# Patient Record
Sex: Male | Born: 1970 | Race: White | Hispanic: No | Smoking: Never smoker
Health system: Southern US, Community
[De-identification: ages and names within clinical notes are randomized; demographics above are authoritative.]

## PROBLEM LIST (undated history)

## (undated) DIAGNOSIS — Z8489 Family history of other specified conditions: Secondary | ICD-10-CM

## (undated) DIAGNOSIS — R112 Nausea with vomiting, unspecified: Secondary | ICD-10-CM

## (undated) DIAGNOSIS — Z1501 Genetic susceptibility to malignant neoplasm of breast: Secondary | ICD-10-CM

## (undated) DIAGNOSIS — Z1509 Genetic susceptibility to other malignant neoplasm: Secondary | ICD-10-CM

## (undated) DIAGNOSIS — I82409 Acute embolism and thrombosis of unspecified deep veins of unspecified lower extremity: Secondary | ICD-10-CM

## (undated) DIAGNOSIS — Z9889 Other specified postprocedural states: Secondary | ICD-10-CM

## (undated) DIAGNOSIS — Z87442 Personal history of urinary calculi: Secondary | ICD-10-CM

## (undated) HISTORY — DX: Other specified postprocedural states: R11.2

## (undated) HISTORY — PX: KNEE ARTHROSCOPY W/ ACL RECONSTRUCTION: SHX1858

## (undated) HISTORY — DX: Other specified postprocedural states: Z98.890

## (undated) HISTORY — DX: Genetic susceptibility to malignant neoplasm of breast: Z15.01

## (undated) HISTORY — DX: Genetic susceptibility to malignant neoplasm of breast: Z15.09

## (undated) HISTORY — DX: Acute embolism and thrombosis of unspecified deep veins of unspecified lower extremity: I82.409

---

## 2004-11-10 ENCOUNTER — Emergency Department: Payer: Self-pay | Admitting: Emergency Medicine

## 2010-01-13 ENCOUNTER — Ambulatory Visit: Payer: Self-pay | Admitting: Orthopedic Surgery

## 2010-01-19 ENCOUNTER — Ambulatory Visit: Payer: Self-pay | Admitting: Orthopedic Surgery

## 2010-01-23 ENCOUNTER — Ambulatory Visit: Payer: Self-pay

## 2010-02-02 ENCOUNTER — Ambulatory Visit: Payer: Self-pay | Admitting: Family Medicine

## 2010-10-29 DIAGNOSIS — I82409 Acute embolism and thrombosis of unspecified deep veins of unspecified lower extremity: Secondary | ICD-10-CM

## 2010-10-29 HISTORY — DX: Acute embolism and thrombosis of unspecified deep veins of unspecified lower extremity: I82.409

## 2011-02-22 ENCOUNTER — Emergency Department: Payer: Self-pay | Admitting: Emergency Medicine

## 2011-06-06 ENCOUNTER — Ambulatory Visit: Payer: Self-pay | Admitting: Urology

## 2013-07-07 ENCOUNTER — Ambulatory Visit: Payer: Self-pay | Admitting: Family Medicine

## 2013-10-29 DIAGNOSIS — Z87442 Personal history of urinary calculi: Secondary | ICD-10-CM

## 2013-10-29 HISTORY — DX: Personal history of urinary calculi: Z87.442

## 2015-02-15 ENCOUNTER — Ambulatory Visit (INDEPENDENT_AMBULATORY_CARE_PROVIDER_SITE_OTHER): Payer: BC Managed Care – PPO | Admitting: Family Medicine

## 2015-02-15 ENCOUNTER — Encounter: Payer: Self-pay | Admitting: Family Medicine

## 2015-02-15 VITALS — BP 134/78 | HR 79 | Temp 97.9°F | Ht 71.25 in | Wt 176.5 lb

## 2015-02-15 DIAGNOSIS — N200 Calculus of kidney: Secondary | ICD-10-CM | POA: Insufficient documentation

## 2015-02-15 DIAGNOSIS — Z8481 Family history of carrier of genetic disease: Secondary | ICD-10-CM | POA: Diagnosis not present

## 2015-02-15 DIAGNOSIS — Z0000 Encounter for general adult medical examination without abnormal findings: Secondary | ICD-10-CM | POA: Diagnosis not present

## 2015-02-15 LAB — COMPREHENSIVE METABOLIC PANEL
ALBUMIN: 4.4 g/dL (ref 3.5–5.2)
ALT: 21 U/L (ref 0–53)
AST: 19 U/L (ref 0–37)
Alkaline Phosphatase: 46 U/L (ref 39–117)
BILIRUBIN TOTAL: 0.7 mg/dL (ref 0.2–1.2)
BUN: 13 mg/dL (ref 6–23)
CALCIUM: 9.3 mg/dL (ref 8.4–10.5)
CO2: 31 meq/L (ref 19–32)
CREATININE: 0.79 mg/dL (ref 0.40–1.50)
Chloride: 103 mEq/L (ref 96–112)
GFR: 113.59 mL/min (ref >60.00)
GLUCOSE: 79 mg/dL (ref 70–99)
Potassium: 4.2 mEq/L (ref 3.5–5.1)
Sodium: 137 mEq/L (ref 135–145)
TOTAL PROTEIN: 6.5 g/dL (ref 6.0–8.3)

## 2015-02-15 LAB — CBC WITH DIFFERENTIAL/PLATELET
Basophils Absolute: 0 10*3/uL (ref 0.0–0.1)
Basophils Relative: 0.4 % (ref 0.0–3.0)
EOS ABS: 0 10*3/uL (ref 0.0–0.7)
Eosinophils Relative: 0.8 % (ref 0.0–5.0)
HEMATOCRIT: 41.8 % (ref 39.0–52.0)
Hemoglobin: 14.4 g/dL (ref 13.0–17.0)
Lymphocytes Relative: 24.5 % (ref 12.0–46.0)
Lymphs Abs: 1.6 10*3/uL (ref 0.7–4.0)
MCHC: 34.6 g/dL (ref 30.0–36.0)
MCV: 91.5 fl (ref 78.0–100.0)
MONO ABS: 0.5 10*3/uL (ref 0.1–1.0)
Monocytes Relative: 7.1 % (ref 3.0–12.0)
Neutro Abs: 4.3 10*3/uL (ref 1.4–7.7)
Neutrophils Relative %: 67.2 % (ref 43.0–77.0)
PLATELETS: 185 10*3/uL (ref 150.0–400.0)
RBC: 4.57 Mil/uL (ref 4.22–5.81)
RDW: 13 % (ref 11.5–15.5)
WBC: 6.4 10*3/uL (ref 4.0–10.5)

## 2015-02-15 LAB — LIPID PANEL
CHOL/HDL RATIO: 4
Cholesterol: 185 mg/dL (ref 0–200)
HDL: 49.7 mg/dL (ref >39.00)
LDL Cholesterol: 120 mg/dL — ABNORMAL HIGH (ref 0–99)
NonHDL: 135.3
Triglycerides: 76 mg/dL (ref 0.0–149.0)
VLDL: 15.2 mg/dL (ref 0.0–40.0)

## 2015-02-15 LAB — PSA: PSA: 1.19 ng/mL (ref 0.10–4.00)

## 2015-02-15 NOTE — Progress Notes (Signed)
Subjective:   Patient ID: Connor Stone, male    DOB: September 26, 1971, 44 y.o.   MRN: [id number]  Connor Stone is a pleasant 44 y.o. year old male who presents to clinic today with [place]  on 02/15/2015  HPI: Family h/o BRCA gene.  Mom died of breast CA when she was 34.  Sister who is 108 yo, just tested positive for BRCA.  He would like to be tested as well.  He has a 5 year old daughter.  H/o nephrolithiasis- thinks he passed one a few weeks ago. Diagnosed on CT in ER (first episode) 2 years ago.  He has not had routine lab work/CPE done in over a year. Dad has prostate CA.  No current outpatient prescriptions on file prior to visit.   No current facility-administered medications on file prior to visit.    No Known Allergies  History reviewed. No pertinent past medical history.  Past Surgical History  Procedure Laterality Date  . Arthroscopic repair acl      Family History  Problem Relation Age of Onset  . Cancer Mother   . Cancer Father   . Diabetes Father   . Cancer Sister     History   Social History  . Marital Status: Married    Spouse Name: N/A  . Number of Children: N/A  . Years of Education: N/A   Occupational History  . Not on file.   Social History Main Topics  . Smoking status: Former Research scientist (life sciences)  . Smokeless tobacco: Never Used  . Alcohol Use: Yes  . Drug Use: No  . Sexual Activity: Yes   Other Topics Concern  . Not on file   Social History Narrative   Married   PE teacher in middle school   73 year old daughter   The PMH, PSH, Social History, Family History, Medications, and allergies have been reviewed in [facility], and have been updated if relevant.  Review of Systems  Constitutional: Negative.   HENT: Negative.   Eyes: Negative.   Respiratory: Negative.   Cardiovascular: Negative.   Gastrointestinal: Negative.   Endocrine: Negative.   Musculoskeletal: Negative.   Skin: Negative.   Allergic/Immunologic: Negative.   Neurological:  Negative.   Hematological: Negative.   Psychiatric/Behavioral: Negative.   All other systems reviewed and are negative.      Objective:    BP 134/78 mmHg  Pulse 79  Temp(Src) 97.9 F (36.6 C) (Oral)  Ht 5' 11.25" (1.81 m)  Wt 176 lb 8 oz (80.06 kg)  BMI 24.44 kg/m2  SpO2 98%   Physical Exam  Constitutional: He is oriented to person, place, and time. He appears well-developed and well-nourished. No distress.  HENT:  Head: Normocephalic.  Eyes: Conjunctivae are normal.  Neck: Normal range of motion.  Cardiovascular: Normal rate and regular rhythm.   Pulmonary/Chest: Breath sounds normal. No respiratory distress. He has no wheezes. He has no rales. He exhibits no tenderness.  Abdominal: Soft.  Musculoskeletal: Normal range of motion. He exhibits no edema.  Neurological: He is alert and oriented to person, place, and time. No cranial nerve deficit.  Skin: Skin is warm and dry.  Psychiatric: He has a normal mood and affect. His behavior is normal. Judgment and thought content normal.  Nursing note and vitals reviewed.         Assessment & Plan:   Visit for well man health check - Plan: CBC with Differential/Platelet, Comprehensive metabolic panel, Lipid panel, PSA  Family history  of BRCA gene positive - Plan: Ambulatory referral to Genetics  Nephrolithiasis No Follow-up on file.

## 2015-02-15 NOTE — Assessment & Plan Note (Signed)
Reviewed preventive care protocols, scheduled due services, and updated immunizations Discussed nutrition, exercise, diet, and healthy lifestyle.  Orders Placed This Encounter  Procedures  . CBC with Differential/Platelet  . Comprehensive metabolic panel  . Lipid panel  . PSA  . Ambulatory referral to [facility]

## 2015-02-15 NOTE — Assessment & Plan Note (Addendum)
Reviewed his sister's chart/results that he brought with him today.  I agree he should be tested for himself and his daughter. Refer to geneticist.

## 2015-02-15 NOTE — Progress Notes (Signed)
Pre visit review using our clinic review tool, if applicable. No additional management support is needed unless otherwise documented below in the visit note. 

## 2015-02-15 NOTE — Assessment & Plan Note (Signed)
No symptoms currently.

## 2015-02-15 NOTE — Patient Instructions (Signed)
Nice to meet you. We will call you with your lab results and you can view them online. Please also stop by to see Rosaria Ferries on your way out to set up your genetics appointment.

## 2015-02-17 ENCOUNTER — Encounter: Payer: Self-pay | Admitting: *Deleted

## 2015-03-04 ENCOUNTER — Telehealth: Payer: Self-pay | Admitting: Genetic Counselor

## 2015-03-04 NOTE — Telephone Encounter (Signed)
Left second message for pt regarding gen counseling appt.

## 2015-03-09 ENCOUNTER — Telehealth: Payer: Self-pay | Admitting: Genetic Counselor

## 2015-03-09 NOTE — Telephone Encounter (Signed)
Attempted to reach patient for a third time.  Left additional message.  Will call referring office today regarding unsuccessful attempts to reach patient.

## 2015-03-15 ENCOUNTER — Telehealth: Payer: Self-pay | Admitting: Genetic Counselor

## 2015-03-15 NOTE — Telephone Encounter (Signed)
GENETIC REFERRAL-S/W PATIENT AND GVE APPT FOR 05/23 @ 11

## 2015-03-21 ENCOUNTER — Other Ambulatory Visit: Payer: BC Managed Care – PPO

## 2015-03-21 ENCOUNTER — Encounter: Payer: Self-pay | Admitting: Genetic Counselor

## 2015-03-21 ENCOUNTER — Ambulatory Visit (HOSPITAL_BASED_OUTPATIENT_CLINIC_OR_DEPARTMENT_OTHER): Payer: BC Managed Care – PPO | Admitting: Genetic Counselor

## 2015-03-21 DIAGNOSIS — Z315 Encounter for genetic counseling: Secondary | ICD-10-CM | POA: Diagnosis not present

## 2015-03-21 DIAGNOSIS — Z8481 Family history of carrier of genetic disease: Secondary | ICD-10-CM | POA: Diagnosis not present

## 2015-03-21 DIAGNOSIS — Z803 Family history of malignant neoplasm of breast: Secondary | ICD-10-CM

## 2015-03-21 DIAGNOSIS — Z8042 Family history of malignant neoplasm of prostate: Secondary | ICD-10-CM

## 2015-03-21 NOTE — Progress Notes (Signed)
[place] Patient Visit  REFERRING PROVIDER: Lucille Passy, MD [address] [place], [place] [address]  PRIMARY PROVIDER:  Arnette Norris, MD  PRIMARY REASON FOR VISIT:  1. Family history of BRCA1 gene positive   2. Family history of malignant neoplasm of breast   3. Family history of prostate cancer     HISTORY OF PRESENT ILLNESS:   Connor Stone, a 44 y.o. male, was seen for a [place] cancer genetics consultation at the request of Dr. Deborra Stone due to a family history of cancer and a recently identified BRCA1 pathogenic gene mutation found in Mr. Connor Stone sister called BRCA1, p.C61G.  Connor Stone presents to clinic today to discuss the possibility of a hereditary predisposition to cancer, genetic testing, and to further clarify his future cancer risks, as well as potential cancer risks for family members. He is accompanied to clinic today by his wife.   CANCER HISTORY:  Connor Stone has no personal history of cancer.   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 A copy of the pedigree with be scanned into Epic under the Media tab. Significant family history diagnoses include the following: Family History  Problem Relation Age of Onset   Cancer Mother 59    bilateral breast cancer; d. 15; no genetic testing   Cancer Father 34    prostate cancer, no genetic testing and is  an only child with a mother with cancer   Diabetes Father    Cancer Sister 8    breast cancer ; BRCA1 positive, sister is Connor Stone (DOB 06/27/70) tested at [facility] with a report date of 01/13/2015. Result is positive for a pathogenic mutation called BRCA1, p.C61G.   Cancer Paternal Grandmother 64    unknown type of rare cancer   Connor Stone ancestry is of [place] (paternal) and [place] (maternal[place] descent. There is no known Jewish ancestry or consanguinity.  GENETIC COUNSELING ASSESSMENT:  Connor Stone is a 44 y.o. male with a family history of cancer and a recently identified BRCA1 pathogenic gene mutation found in Connor Stone sister called BRCA1, p.C61G. Connor Stone is at 50% risk for this mutation. In addition, to date, the BRCA1 pathogenic mutation has not been confirmed to be on the maternal or paternal side of the family as Connor Stone father has not pursued genetic testing. His father was diagnosed with prostate cancer and is an only child, and his paternal grandmother had an unknown type of rare cancer per Connor Stone. We, therefore, discussed and recommended the following at today's visit.   DISCUSSION:  We reviewed the characteristics, features and inheritance patterns of hereditary cancer syndromes. We also discussed genetic testing, including the appropriate family members to test, the process of  testing, insurance coverage and turn-around-time for results. We discussed the implications of a negative, positive and/or variant of uncertain significant result. Because it is unclear which side of the family the mutation is on, we recommended Connor Stone pursue genetic testing for the CancerNext-Expanded gene panel. The CancerNext-Expanded gene panel offered by [facility] analyzes the following 49 genes: APC, ATM, BAP1, BARD1, BMPR1A, BRCA1, BRCA2, BRIP1, CDH1, CDK4, CDKN2A, CHEK2, EPCAM, FH, FLCN, GREM1, MAX, MEN1, MITF, MET, MLH1, MRE11A, MSH2, MSH6, MUTYH, NBN, NF1, PALB2, PMS2, POLD1, POLE,  PTEN, RAD50, RAD51C, RAD51D, RET, SDHA, SDHAF2, SDHB, SDHC, SDHD, SMAD4, SMARCA4, STK11, TMEM127, TP53, TSC1, TSC2, and VHL. We recommend this because although it is unlikely, it is possible this familial BRCA1 mutation found in Connor Stone sister was inherited from the paternal side of the family and his mother's early breast cancer diagnosis could have been due to a different hereditary cancer syndrome associated with a different gene mutation.    PLAN:  Based on our above recommendation, Connor Stone wished to pursue genetic testing and the blood sample was drawn and will be sent to [facility] for analysis. Results should be available within approximately 4 weeks time, at which point they will be disclosed by telephone to Connor Stone, as will any additional recommendations warranted by these results. We encouraged Connor Stone to remain in contact with cancer genetics annually so that we can continuously update the family history and inform him of any changes in cancer genetics and testing that may be of benefit for this family.   Mr.  Nidiffer questions were answered to his satisfaction today. Our contact information was provided should additional questions or concerns arise. Thank you for the referral and allowing [place] to share in the care of your patient.   Connor A. Fine, MS, CGC Certified Psychologist, [email or web address] phone: [phone]  The patient was seen for a total of 45 minutes in face-to-face genetic counseling.  This patient was discussed with Dr. Jana Stone who agrees with the above.    ______________________________________________________________________ For Office Staff:  Number of people involved in session including genetic counselor: 3 Was an intern or student involved with case: not applicable

## 2015-04-18 ENCOUNTER — Encounter: Payer: Self-pay | Admitting: Genetic Counselor

## 2015-04-18 DIAGNOSIS — Z8042 Family history of malignant neoplasm of prostate: Secondary | ICD-10-CM

## 2015-04-18 DIAGNOSIS — Z8481 Family history of carrier of genetic disease: Secondary | ICD-10-CM

## 2015-04-18 DIAGNOSIS — Z803 Family history of malignant neoplasm of breast: Secondary | ICD-10-CM

## 2015-04-18 NOTE — Progress Notes (Signed)
[facility] Genetic Test Results   REFERRING PROVIDER: Arnette Norris, MD  PRIMARY PROVIDER:  Arnette Norris, MD  PRIMARY REASON FOR VISIT:  Patient Active Problem List   Diagnosis Date Noted   Family history of malignant neoplasm of breast 03/21/2015   Family history of BRCA1 gene positive 03/21/2015   Family history of prostate cancer 03/21/2015   Family history of BRCA gene positive 02/15/2015   Nephrolithiasis 02/15/2015   Visit for well man health check 02/15/2015    GENETIC TEST RESULT:  Testing Laboratory: [facility]  Test Ordered: CancerNext-Expanded gene panel Date of Report: 04/12/2015 Result: Positive for a pathogenic gene mutation called BRCA1, p.C61G General Interpretation: Confirmed Hereditary Breast Ovarian Cancer syndrome, 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 Nancarrow was previously seen in the [facility] due to concerns regarding a hereditary predisposition to cancer. Please refer to our prior cancer genetics clinic note for more information regarding Mr. Shenk medical, social and family histories, and our assessment and recommendations, at the time. Mr. Portman genetic test results and recommendations warranted by these results were recently disclosed to him and are discussed in more detail below.  GENETIC TESTING: At the time of Mr. Stief visit, we recommended he pursue genetic testing of the CancerNext-Expanded gene panel. This test, which included sequencing and deletion/duplication analysis of several genes associated with increased risk for cancer, was performed at [facility]. The CancerNext-Expanded gene panel offered by [facility] analyzes the following 49 genes: APC, ATM, BAP1, BARD1, BMPR1A, BRCA1, BRCA2, BRIP1, CDH1, CDK4, CDKN2A, CHEK2, EPCAM, FH, FLCN, GREM1, MAX, MEN1, MITF, MET, MLH1, MRE11A, MSH2, MSH6, MUTYH, NBN, NF1, PALB2, PMS2, POLD1, POLE, PTEN, RAD50, RAD51C,  RAD51D, RET, SDHA, SDHAF2, SDHB, SDHC, SDHD, SMAD4, SMARCA4, STK11, TMEM127, TP53, TSC1, TSC2, and VHL.  Testing revealed a mutation in the BRCA1 gene called BRCA1, p.C61G.  MEDICAL MANAGEMENT: Individuals who have a BRCA1 pathogenic gene mutation have an increased risk for cancer. Thus, the following management guidelines and screening recommendations are recommended for Mr. Casteneda, which can coordinated by Mr. Capri primary physican or overseeing healthcare provider(s).  For men who harbor mutations, the general risk for cancer seems to be only slightly increased. As we discussed, BRCA1 mutations confer a slightly increased risk for breast cancer in men. We recommend that Mr. Zenon primary physician perform a clinical chest exam yearly. Alternatively, Mr. Yeargan can be seen by a high-risk breast clinic once a year for a clinical chest exam. We also recommend he perform chest self-exams routinely and if a mass is noted, this should be brought to his physican's attention immediately.   With regard to screening for other types of cancer, we estimate Mr. Sedano risk for prostate cancer to be only slightly increased over that of the general population risk. We simply suggest adhering faithfully to the general population screening recommendations including annual colon cancer screening starting at the age of 34 and prostate cancer screening if you and your physician decide to pursue that.  FAMILY MEMBERS: It is important that all of Mr. Veldhuizen relatives (both men and women) know of the presence of this gene mutation. Site-specific genetic testing can sort out who in the family is at risk and who is not.   Mr. Cosgriff daughter is at a 50% chance to have inherited this mutation. However, she is young and this will not be of any consequence to her for several years. We do not test children  because there is no risk to them until they are adults. We recommend they have genetic counseling and testing by the  time they are in their early 20s.    Mr. Buenger siblings each have a 50% chance to have inherited this mutation. We recommend they have genetic testing for this same mutation, as identifying the presence of this mutation would allow them to also take advantage of risk-reducing measures.   SUPPORT AND RESOURCES: If Mr. Taras is interested in BRCA-specific information and support, there are two groups, Facing Our Risk (www.facingourrisk.com) and Bright Pink (www.brightpink.org) which some people have found useful. They provide opportunities to speak with other individuals from high-risk families. To locate genetic counselors in other cities, visit the website of the [employer] (ArtistMovie.se) and Secretary/administrator for a Social worker by zip code.  We encouraged Mr. Swicegood to remain in contact with [place] on an annual basis so we can update his personal and family histories, and let him know of advances in cancer genetics that may benefit the family. Our contact number was provided. Mr. Kliebert questions were answered to his satisfaction today, and he knows he is welcome to call anytime with additional questions.    Catherine A. Fine, MS, CGC Certified Genetic Counselor phone: [phone] [email or web address]

## 2015-05-10 ENCOUNTER — Encounter: Payer: Self-pay | Admitting: Genetic Counselor

## 2015-05-10 ENCOUNTER — Telehealth: Payer: Self-pay | Admitting: Genetic Counselor

## 2015-05-10 DIAGNOSIS — Z0000 Encounter for general adult medical examination without abnormal findings: Secondary | ICD-10-CM | POA: Insufficient documentation

## 2015-05-10 DIAGNOSIS — Z1379 Encounter for other screening for genetic and chromosomal anomalies: Secondary | ICD-10-CM | POA: Insufficient documentation

## 2015-05-10 NOTE — Telephone Encounter (Signed)
Patient called for reassurance about his bill.  He received an EOB from [place] that states he will owe more than $5000.  We discussed that Cephus Shelling sees all the correspondence from [facility] that he sees, and that they can appeal up to about 3 times, so he may receive this again.  Cephus Shelling will not start a test if they determine a cost to be more than $100 OOP cost, so he should not be billed more than $100, regardless of how much [employer] pays or does not pay.  OFfered to give [facility]'s phone number.  He stated that this is similar to what Cat had told him, and therefore will wait until he receives a bill from [place].

## 2015-05-24 ENCOUNTER — Encounter: Payer: Self-pay | Admitting: Family Medicine

## 2015-05-24 ENCOUNTER — Ambulatory Visit (INDEPENDENT_AMBULATORY_CARE_PROVIDER_SITE_OTHER): Payer: BC Managed Care – PPO | Admitting: Family Medicine

## 2015-05-24 VITALS — BP 134/66 | HR 78 | Temp 98.1°F | Wt 175.5 lb

## 2015-05-24 DIAGNOSIS — K921 Melena: Secondary | ICD-10-CM | POA: Diagnosis not present

## 2015-05-24 DIAGNOSIS — Z1509 Genetic susceptibility to other malignant neoplasm: Secondary | ICD-10-CM

## 2015-05-24 DIAGNOSIS — R194 Change in bowel habit: Secondary | ICD-10-CM | POA: Diagnosis not present

## 2015-05-24 DIAGNOSIS — Z1501 Genetic susceptibility to malignant neoplasm of breast: Secondary | ICD-10-CM | POA: Diagnosis not present

## 2015-05-24 NOTE — Progress Notes (Signed)
Pre visit review using our clinic review tool, if applicable. No additional management support is needed unless otherwise documented below in the visit note. 

## 2015-05-24 NOTE — Patient Instructions (Signed)
Great to see you. Please stop by to see Connor Stone on [place] [address] on your way out.

## 2015-05-24 NOTE — Progress Notes (Signed)
Subjective:   Patient ID: Connor Stone, male    DOB: September 10, 1971, 44 y.o.   MRN: [id number]  Connor Stone is a pleasant 44 y.o. year old male who presents to clinic today with Abdominal Pain and Blood In Stools  on 05/24/2015  HPI:  3-6 months of intermittent episodes of abdominal cramping, loose stools with blood and mucous in his stools.  Pain not necessarily relieved by defecation.  No nausea or vomiting.  No sores in his mouth. No known family h/o IBD.  No black stools.  He did just test positive for BRCA gene.  Strong family h/o BRCA gene.  Mom died of breast CA when she was 14.  Sister who is 34 yo is also BRCA positive.  No current outpatient prescriptions on file prior to visit.   No current facility-administered medications on file prior to visit.    No Known Allergies  No past medical history on file.  Past Surgical History  Procedure Laterality Date  . Arthroscopic repair acl      Family History  Problem Relation Age of Onset  . Cancer Mother 44    bilateral breast cancer; d. 44; no genetic testing  . Cancer Father 44    prostate cancer, no genetic testing and is an only child with a mother with cancer  . Diabetes Father   . Cancer Sister 44    44 breast cancer ; BRCA1 positive, sister is Jermond Burkemper (DOB 06/27/70) tested at [facility] with a report date of 01/13/2015. Result is positive for a pathogenic mutation called BRCA1, p.C61G.  . Cancer Paternal Grandmother 44    unknown type of rare cancer    History   Social History  . Marital Status: Married    Spouse Name: N/A  . Number of Children: N/A  . Years of Education: N/A   Occupational History  . Not on file.   Social History Main Topics  . Smoking status: Former Research scientist (life sciences)  . Smokeless tobacco: Never Used  . Alcohol Use: Yes  . Drug Use: No  . Sexual Activity: Yes   Other Topics Concern  . 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   Review of Systems     Objective:    BP 134/66 mmHg  Pulse 78  Temp(Src) 98.1 F (36.7 C) (Oral)  Wt 175 lb 8 oz (79.606 kg)  SpO2 99%  Wt Readings from Last 3 Encounters:  05/24/15 175 lb 8 oz (79.606 kg)  02/15/15 176 lb 8 oz (80.06 kg)    Physical Exam  Constitutional: He is oriented to person, place, and time. He appears well-developed and well-nourished. No distress.  HENT:  Head: Normocephalic.  Eyes: Conjunctivae are normal.  Cardiovascular: Normal rate.   Pulmonary/Chest: Effort normal.  Musculoskeletal: Normal range of motion.  Neurological: He is alert and oriented to person, place, and time.  Skin: Skin is warm and dry.  Psychiatric: He has a normal mood and affect. His behavior is normal. Thought content normal.  Nursing note and vitals reviewed.         Assessment & Plan:   Change in bowel habits - Plan: Ambulatory referral to Gastroenterology  BRCA positive - Plan: Ambulatory referral to Gastroenterology  Blood in stool - Plan: Ambulatory referral to Gastroenterology No Follow-up on file.

## 2015-05-24 NOTE — Assessment & Plan Note (Signed)
New- intermittent with mucous and abdominal pain. Also strong FH of CA with BRCA gene confirmed. Refer for colonoscopy- rule out IBD/malignancy. The patient indicates understanding of these issues and agrees with the plan.

## 2015-05-26 ENCOUNTER — Ambulatory Visit (INDEPENDENT_AMBULATORY_CARE_PROVIDER_SITE_OTHER): Payer: BC Managed Care – PPO | Admitting: Nurse Practitioner

## 2015-05-26 ENCOUNTER — Encounter: Payer: Self-pay | Admitting: Nurse Practitioner

## 2015-05-26 VITALS — BP 120/74 | HR 68 | Ht 71.5 in | Wt 175.0 lb

## 2015-05-26 DIAGNOSIS — K625 Hemorrhage of anus and rectum: Secondary | ICD-10-CM | POA: Diagnosis not present

## 2015-05-26 MED ORDER — NA SULFATE-K SULFATE-MG SULF 17.5-3.13-1.6 GM/177ML PO SOLN: 1.0000 | Freq: Once | ORAL | Status: DC

## 2015-05-26 MED ORDER — DICYCLOMINE HCL 20 MG PO TABS: 20.0000 mg | ORAL_TABLET | Freq: Two times a day (BID) | ORAL | Status: DC

## 2015-05-26 NOTE — Progress Notes (Addendum)
    HPI :  Patient is a 44 year old male referred by PCP for evaluation of rectal bleeding. Over the last couple of years patient has had periods of lower abdominal cramping, occasional loose stool but no significant diarrhea. The rectal bleeding is episodic. In between episodes of bowel changes and rectal bleeding patient feels completely fine.  Patient told by health care provider less than a year ago that he has hemorrhoids. His weight is stable. Normal CBC mid June.   Past Medical History  Diagnosis Date  . Kidney stones   . DVT (deep venous thrombosis)     right leg  . Post-operative nausea and vomiting   . BRCA1 positive     Family History  Problem Relation Age of Onset  . Breast cancer Mother 18    bilateral breast cancer; d. 15; no genetic testing  . Prostate cancer Father 54    no genetic testing and is an only child with a mother with cancer  . Diabetes Father   . Breast cancer Sister 3     BRCA1 positive, sister is Miner Koral (DOB 06/27/70) tested at [facility] with a report date of 01/13/2015. Result is positive for a pathogenic mutation called BRCA1, p.C61G.  . Cancer Paternal Grandmother 33    unknown type of rare cancer   History  Substance Use Topics  . Smoking status: Never Smoker   . Smokeless tobacco: Never Used  . Alcohol Use: 0.0 oz/week    0 Standard drinks or equivalent per week     Comment: 2-4 per day   No current outpatient prescriptions on file.   No current facility-administered medications for this visit.   No Known Allergies   Review of Systems: All systems reviewed and negative except where noted in HPI.   Physical Exam: BP 120/74 mmHg  Pulse 68  Ht 5' 11.5" (1.816 m)  Wt 175 lb (79.379 kg)  BMI 24.07 kg/m2 Constitutional: Pleasant,well-developed, white male in no acute distress. HEENT: Normocephalic and atraumatic. Conjunctivae are normal. No scleral icterus. Neck supple.  Cardiovascular: Normal rate, regular rhythm.  Pulmonary/chest:  Effort normal and breath sounds normal. No wheezing, rales or rhonchi. Abdominal: Soft, nondistended, nontender. Bowel sounds active throughout. There are no masses palpable. No hepatomegaly. Extremities: no edema Lymphadenopathy: No cervical adenopathy noted. Neurological: Alert and oriented to person place and time. Skin: Skin is warm and dry. No rashes noted. Psychiatric: Normal mood and affect. Behavior is normal.   ASSESSMENT AND PLAN:  44 year old male two year history of episodic lower abdominal cramps, occasional loose stool and intermittent rectal bleeding. Patient gives history of hemorrhoids. He could have IBS with hemorrhoidal bleeding. Colitis, colon neoplasm and other etiologies need to be excluded. For further evaluation patient will be scheduled for colonoscopy with Dr. Hilarie Fredrickson. The risks, benefits, and alternatives to colonoscopy with possible biopsy and possible polypectomy were discussed with the patient and he consents to proceed.  Trial of Bentyl twice daily as needed for cramps  Cc:  Arnette Norris, MD  Addendum: Reviewed and agree with initial management. Jerene Bears, MD

## 2015-05-26 NOTE — Addendum Note (Signed)
Addended by: Willia Craze on: 05/26/2015 04:17 PM   Modules accepted: Level of Service

## 2015-05-26 NOTE — Patient Instructions (Signed)
We have sent the following medications to your pharmacy for you to pick up at your convenience:  Bentyl  You have been scheduled for a colonoscopy. Please follow written instructions given to you at your visit today.  Please pick up your prep supplies at the pharmacy within the next 1-3 days. If you use inhalers (even only as needed), please bring them with you on the day of your procedure.  

## 2015-08-04 ENCOUNTER — Encounter: Payer: Self-pay | Admitting: Internal Medicine

## 2015-08-04 ENCOUNTER — Ambulatory Visit (AMBULATORY_SURGERY_CENTER): Payer: BC Managed Care – PPO | Admitting: Internal Medicine

## 2015-08-04 VITALS — BP 148/98 | HR 70 | Temp 96.4°F | Resp 16 | Ht 71.0 in | Wt 175.0 lb

## 2015-08-04 DIAGNOSIS — D120 Benign neoplasm of cecum: Secondary | ICD-10-CM

## 2015-08-04 DIAGNOSIS — R194 Change in bowel habit: Secondary | ICD-10-CM

## 2015-08-04 DIAGNOSIS — K625 Hemorrhage of anus and rectum: Secondary | ICD-10-CM | POA: Diagnosis not present

## 2015-08-04 DIAGNOSIS — K635 Polyp of colon: Secondary | ICD-10-CM | POA: Diagnosis not present

## 2015-08-04 DIAGNOSIS — D125 Benign neoplasm of sigmoid colon: Secondary | ICD-10-CM

## 2015-08-04 DIAGNOSIS — D127 Benign neoplasm of rectosigmoid junction: Secondary | ICD-10-CM | POA: Diagnosis not present

## 2015-08-04 DIAGNOSIS — K921 Melena: Secondary | ICD-10-CM | POA: Diagnosis not present

## 2015-08-04 MED ORDER — SODIUM CHLORIDE 0.9 % IV SOLN: 500.0000 mL | INTRAVENOUS | Status: DC

## 2015-08-04 NOTE — Progress Notes (Signed)
Transferred to recovery room. A/O x3, pleased with MAC.  VSS.  Report to Karen, RN. 

## 2015-08-04 NOTE — Progress Notes (Signed)
Called to room to assist during endoscopic procedure.  Patient ID and intended procedure confirmed with present staff. Received instructions for my participation in the procedure from the performing physician.  

## 2015-08-04 NOTE — Patient Instructions (Addendum)
  AVOID ASPIRIN, ASPIRIN PRODUCTS AND NSAIDS (MOTRIN, ADVIL, ALEVE, IBUPROFEN ETC) FOR TWO WEEKS, October 20,2016.   YOU HAD AN ENDOSCOPIC PROCEDURE TODAY AT [facility]:   Refer to the procedure report that was given to you for any specific questions about what was found during the examination.  If the procedure report does not answer your questions, please call your gastroenterologist to clarify.  If you requested that your care partner not be given the details of your procedure findings, then the procedure report has been included in a sealed envelope for you to review at your convenience later.  YOU SHOULD EXPECT: Some feelings of bloating in the abdomen. Passage of more gas than usual.  Walking can help get rid of the air that was put into your GI tract during the procedure and reduce the bloating. If you had a lower endoscopy (such as a colonoscopy or flexible sigmoidoscopy) you may notice spotting of blood in your stool or on the toilet paper. If you underwent a bowel prep for your procedure, you may not have a normal bowel movement for a few days.  Please Note:  You might notice some irritation and congestion in your nose or some drainage.  This is from the oxygen used during your procedure.  There is no need for concern and it should clear up in a day or so.  SYMPTOMS TO REPORT IMMEDIATELY:   Following lower endoscopy (colonoscopy or flexible sigmoidoscopy):  Excessive amounts of blood in the stool  Significant tenderness or worsening of abdominal pains  Swelling of the abdomen that is new, acute  Fever of 100F or higher   For urgent or emergent issues, a gastroenterologist can be reached at any hour by calling [phone].   DIET: Your first meal following the procedure should be a small meal and then it is ok to progress to your normal diet. Heavy or fried foods are harder to digest and may make you feel nauseous or bloated.  Likewise, meals heavy in dairy and  vegetables can increase bloating.  Drink plenty of fluids but you should avoid alcoholic beverages for 24 hours.  ACTIVITY:  You should plan to take it easy for the rest of today and you should NOT DRIVE or use heavy machinery until tomorrow (because of the sedation medicines used during the test).    FOLLOW UP: Our staff will call the number listed on your records the next business day following your procedure to check on you and address any questions or concerns that you may have regarding the information given to you following your procedure. If we do not reach you, we will leave a message.  However, if you are feeling well and you are not experiencing any problems, there is no need to return our call.  We will assume that you have returned to your regular daily activities without incident.  If any biopsies were taken you will be contacted by phone or by letter within the next 1-3 weeks.  Please call us at [phone] if you have not heard about the biopsies in 3 weeks.    SIGNATURES/CONFIDENTIALITY: You and/or your care partner have signed paperwork which will be entered into your electronic medical record.  These signatures attest to the fact that that the information above on your After Visit Summary has been reviewed and is understood.  Full responsibility of the confidentiality of this discharge information lies with you and/or your care-partner.

## 2015-08-04 NOTE — Op Note (Signed)
[place]  [address] [place], [id number]   COLONOSCOPY PROCEDURE REPORT  PATIENT: Connor Stone, Connor Stone  MR#: [id number] BIRTHDATE: 20-Aug-1971 , 43  yrs. old GENDER: male ENDOSCOPIST: Jerene Bears, MD PROCEDURE DATE:  08/04/2015 PROCEDURE:   Colonoscopy, diagnostic, Colonoscopy with biopsy, Colonoscopy with cold biopsy polypectomy, Colonoscopy with snare polypectomy, and Submucosal injection, any substance First Screening Colonoscopy - Avg.  risk and is 50 yrs.  old or older - No.  Prior Negative Screening - Now for repeat screening. N/A  History of Adenoma - Now for follow-up colonoscopy & has been > or = to 3 yrs.  N/A  Polyps removed today? Yes ASA CLASS:   Class II INDICATIONS:rectal bleeding, intermittent lower abd cramping pain with loose stools and tenesmus. MEDICATIONS: Monitored anesthesia care and Propofol 550 mg IV  DESCRIPTION OF PROCEDURE:   After the risks benefits and alternatives of the procedure were thoroughly explained, informed consent was obtained.  The digital rectal exam revealed no rectal mass.   The LB [id number]  endoscope was introduced through the anus and advanced to the terminal ileum which was intubated for a short distance. No adverse events experienced.   The quality of the prep was good.  (Suprep was used)  The instrument was then slowly withdrawn as the colon was fully examined. Estimated blood loss is zero unless otherwise noted in this procedure report.    COLON FINDINGS: The examined terminal ileum appeared to be normal. A sessile polyp measuring 2 mm in size was found at the cecum.  A polypectomy was performed with cold forceps.  The resection was complete, the polyp tissue was completely retrieved and sent to histology.   A pedunculated polyp measuring 25 mm in size was found in the sigmoid colon.  A 1:10,000 Epinephrine solution injection was given to deliver sclerosing therapy during scope insertion. During  withdrawal a polypectomy was performed using snare cautery. The resection was complete, the polyp tissue was completely retrieved and sent to histology.  The polypectomy site was closed by placing a single hemoclip.   There was moderate diverticulosis noted throughout the entire examined colon with associated mild inflammatory changes in the left colon.  Biopsies were obtained from the left colon.  Retroflexed views revealed internal hemorrhoids. The time to cecum = 10.7 Withdrawal time = 18.4   The scope was withdrawn and the procedure completed.  COMPLICATIONS: There were no immediate complications.  ENDOSCOPIC IMPRESSION: 1.   The examined terminal ileum appeared to be normal 2.   Sessile polyp was found at the cecum; polypectomy was performed with cold forceps 3.   Pedunculated polyp was found in the sigmoid colon; injection was given to deliver sclerosing therapy; polypectomy was performed using snare cautery; 1 hemoclip placed for prophylaxis of bleeding 4.   There was moderate diverticulosis noted throughout the entire examined colon  RECOMMENDATIONS: 1.  Hold Aspirin and all other NSAIDs for 2 weeks. 2.  Await pathology results 3.  Timing of repeat colonoscopy will be determined by pathology findings. 4.  You will receive a letter within 1-2 weeks with the results of your biopsy as well as final recommendations.  Please call my office if you have not received a letter after 3 weeks.  eSigned:  Jerene Bears, MD 08/04/2015 8:48 AM   cc: Arnette Norris MD and The Patient   PATIENT NAME:  Connor Stone, Connor Stone

## 2015-08-05 ENCOUNTER — Telehealth: Payer: Self-pay | Admitting: *Deleted

## 2015-08-05 NOTE — Telephone Encounter (Signed)
  Follow up Call-  Call back number 08/04/2015  Post procedure Call Back phone  # [phone]  Permission to leave phone message Yes     Patient questions:  Do you have a fever, pain , or abdominal swelling? No. Pain Score  0 *  Have you tolerated food without any problems? Yes.    Have you been able to return to your normal activities? Yes.    Do you have any questions about your discharge instructions: Diet   No. Medications  No. Follow up visit  No.  Do you have questions or concerns about your Care? No.  Actions: * If pain score is 4 or above: No action needed, pain <4.

## 2015-08-09 ENCOUNTER — Encounter: Payer: Self-pay | Admitting: Internal Medicine

## 2016-02-17 ENCOUNTER — Emergency Department: Payer: BC Managed Care – PPO

## 2016-02-17 ENCOUNTER — Emergency Department
Admission: EM | Admit: 2016-02-17 | Discharge: 2016-02-17 | Disposition: A | Discharge status: Home / Self Care | Payer: BC Managed Care – PPO | Attending: Emergency Medicine | Admitting: Emergency Medicine

## 2016-02-17 ENCOUNTER — Encounter: Payer: Self-pay | Admitting: Emergency Medicine

## 2016-02-17 DIAGNOSIS — S0990XA Unspecified injury of head, initial encounter: Secondary | ICD-10-CM | POA: Diagnosis not present

## 2016-02-17 DIAGNOSIS — I82401 Acute embolism and thrombosis of unspecified deep veins of right lower extremity: Secondary | ICD-10-CM | POA: Insufficient documentation

## 2016-02-17 DIAGNOSIS — S20212A Contusion of left front wall of thorax, initial encounter: Secondary | ICD-10-CM | POA: Diagnosis not present

## 2016-02-17 DIAGNOSIS — Y9383 Activity, rough housing and horseplay: Secondary | ICD-10-CM | POA: Insufficient documentation

## 2016-02-17 DIAGNOSIS — R55 Syncope and collapse: Secondary | ICD-10-CM | POA: Insufficient documentation

## 2016-02-17 DIAGNOSIS — Y999 Unspecified external cause status: Secondary | ICD-10-CM | POA: Diagnosis not present

## 2016-02-17 DIAGNOSIS — W01198A Fall on same level from slipping, tripping and stumbling with subsequent striking against other object, initial encounter: Secondary | ICD-10-CM | POA: Diagnosis not present

## 2016-02-17 DIAGNOSIS — Y92000 Kitchen of unspecified non-institutional (private) residence as  the place of occurrence of the external cause: Secondary | ICD-10-CM | POA: Diagnosis not present

## 2016-02-17 DIAGNOSIS — R0789 Other chest pain: Secondary | ICD-10-CM | POA: Diagnosis present

## 2016-02-17 DIAGNOSIS — S20219A Contusion of unspecified front wall of thorax, initial encounter: Secondary | ICD-10-CM

## 2016-02-17 LAB — COMPREHENSIVE METABOLIC PANEL
ALT: 20 U/L (ref 17–63)
ANION GAP: 6 (ref 5–15)
AST: 17 U/L (ref 15–41)
Albumin: 4 g/dL (ref 3.5–5.0)
Alkaline Phosphatase: 46 U/L (ref 38–126)
BILIRUBIN TOTAL: 0.8 mg/dL (ref 0.3–1.2)
BUN: 17 mg/dL (ref 6–20)
CALCIUM: 8.3 mg/dL — AB (ref 8.9–10.3)
CO2: 25 mmol/L (ref 22–32)
Chloride: 104 mmol/L (ref 101–111)
Creatinine, Ser: 0.86 mg/dL (ref 0.61–1.24)
GFR calc Af Amer: >60 mL/min (ref >60)
Glucose, Bld: 112 mg/dL — ABNORMAL HIGH (ref 65–99)
POTASSIUM: 3.6 mmol/L (ref 3.5–5.1)
Sodium: 135 mmol/L (ref 135–145)
TOTAL PROTEIN: 6.5 g/dL (ref 6.5–8.1)

## 2016-02-17 LAB — CBC
HEMATOCRIT: 38.1 % — AB (ref 40.0–52.0)
Hemoglobin: 13.6 g/dL (ref 13.0–18.0)
MCH: 31.7 pg (ref 26.0–34.0)
MCHC: 35.6 g/dL (ref 32.0–36.0)
MCV: 88.9 fL (ref 80.0–100.0)
Platelets: 179 10*3/uL (ref 150–440)
RBC: 4.28 MIL/uL — ABNORMAL LOW (ref 4.40–5.90)
RDW: 13.2 % (ref 11.5–14.5)
WBC: 7.9 10*3/uL (ref 3.8–10.6)

## 2016-02-17 LAB — TROPONIN I: Troponin I: <0.03 ng/mL (ref <0.031)

## 2016-02-17 MED ORDER — SODIUM CHLORIDE 0.9 % IV SOLN
1000.0000 mL | Freq: Once | INTRAVENOUS | Status: AC
  Administered 2016-02-17: 1000 mL via INTRAVENOUS

## 2016-02-17 MED ORDER — KETOROLAC TROMETHAMINE 30 MG/ML IJ SOLN
30.0000 mg | Freq: Once | INTRAMUSCULAR | Status: AC
  Administered 2016-02-17: 30 mg via INTRAVENOUS
  Filled 2016-02-17: qty 1

## 2016-02-17 MED ORDER — NAPROXEN 500 MG PO TABS: 500.0000 mg | ORAL_TABLET | Freq: Two times a day (BID) | ORAL | Status: DC

## 2016-02-17 MED ORDER — HYDROCODONE-ACETAMINOPHEN 5-325 MG PO TABS: 1.0000 | ORAL_TABLET | ORAL | Status: DC | PRN

## 2016-02-17 MED ORDER — HYDROCODONE-ACETAMINOPHEN 5-325 MG PO TABS
1.0000 | ORAL_TABLET | Freq: Once | ORAL | Status: AC
  Administered 2016-02-17: 1 via ORAL
  Filled 2016-02-17: qty 1

## 2016-02-17 NOTE — ED Notes (Signed)
Pt presents to via EMS with c/o mid to left chest pain pain and headache. Pt reports that he started having chest after falling backward while horse playing with his daughter. Pt reports syncope x2 afterward. Pt alerts and oriented x4 at this time, airway intact.

## 2016-02-17 NOTE — ED Notes (Signed)
Patient transported to CT 

## 2016-02-17 NOTE — Discharge Instructions (Signed)
Chest Contusion A chest contusion is a deep bruise on your chest area. Contusions are the result of an injury that caused bleeding under the skin. A chest contusion may involve bruising of the skin, muscles, or ribs. The contusion may turn blue, purple, or yellow. Minor injuries will give you a painless contusion, but more severe contusions may stay painful and swollen for a few weeks. CAUSES  A contusion is usually caused by a blow, trauma, or direct force to an area of the body. SYMPTOMS   Swelling and redness of the injured area.  Discoloration of the injured area.  Tenderness and soreness of the injured area.  Pain. DIAGNOSIS  The diagnosis can be made by taking a history and performing a physical exam. An X-ray, CT scan, or MRI may be needed to determine if there were any associated injuries, such as broken bones (fractures) or internal injuries. TREATMENT  Often, the best treatment for a chest contusion is resting, icing, and applying cold compresses to the injured area. Deep breathing exercises may be recommended to reduce the risk of pneumonia. Over-the-counter medicines may also be recommended for pain control. HOME CARE INSTRUCTIONS   Put ice on the injured area.  Put ice in a plastic bag.  Place a towel between your skin and the bag.  Leave the ice on for 15-20 minutes, 03-04 times a day.  Only take over-the-counter or prescription medicines as directed by your caregiver. Your caregiver may recommend avoiding anti-inflammatory medicines (aspirin, ibuprofen, and naproxen) for 48 hours because these medicines may increase bruising.  Rest the injured area.  Perform deep-breathing exercises as directed by your caregiver.  Stop smoking if you smoke.  Do not lift objects over 5 pounds (2.3 kg) for 3 days or longer if recommended by your caregiver. SEEK IMMEDIATE MEDICAL CARE IF:   You have increased bruising or swelling.  You have pain that is getting worse.  You have  difficulty breathing.  You have dizziness, weakness, or fainting.  You have blood in your urine or stool.  You cough up or vomit blood.  Your swelling or pain is not relieved with medicines. MAKE SURE YOU:   Understand these instructions.  Will watch your condition.  Will get help right away if you are not doing well or get worse.   This information is not intended to replace advice given to you by your health care provider. Make sure you discuss any questions you have with your health care provider.   Document Released: 07/10/2001 Document Revised: 07/09/2012 Document Reviewed: 04/07/2012 Elsevier Interactive Patient Education 2016 [place] Injury, Adult You have a head injury. Headaches and throwing up (vomiting) are common after a head injury. It should be easy to wake up from sleeping. Sometimes you must stay in the hospital. Most problems happen within the first 24 hours. Side effects may occur up to 7-10 days after the injury.  WHAT ARE THE TYPES OF HEAD INJURIES? Head injuries can be as minor as a bump. Some head injuries can be more severe. More severe head injuries include:  A jarring injury to the brain (concussion).  A bruise of the brain (contusion). This mean there is bleeding in the brain that can cause swelling.  A cracked skull (skull fracture).  Bleeding in the brain that collects, clots, and forms a bump (hematoma). WHEN SHOULD I GET HELP RIGHT AWAY?   You are confused or sleepy.  You cannot be woken up.  You feel sick to  your stomach (nauseous) or keep throwing up (vomiting).  Your dizziness or unsteadiness is getting worse.  You have very bad, lasting headaches that are not helped by medicine. Take medicines only as told by your doctor.  You cannot use your arms or legs like normal.  You cannot walk.  You notice changes in the black spots in the center of the colored part of your eye (pupil).  You have clear or bloody fluid coming  from your nose or ears.  You have trouble seeing. During the next 24 hours after the injury, you must stay with someone who can watch you. This person should get help right away (call 911 in the [place] if you start to shake and are not able to control it (have seizures), you pass out, or you are unable to wake up. HOW CAN I PREVENT A HEAD INJURY IN THE FUTURE?  Wear seat belts.  Wear a helmet while bike riding and playing sports like football.  Stay away from dangerous activities around the house. WHEN CAN I RETURN TO NORMAL ACTIVITIES AND ATHLETICS? See your doctor before doing these activities. You should not do normal activities or play contact sports until 1 week after the following symptoms have stopped:  Headache that does not go away.  Dizziness.  Poor attention.  Confusion.  Memory problems.  Sickness to your stomach or throwing up.  Tiredness.  Fussiness.  Bothered by bright lights or loud noises.  Anxiousness or depression.  Restless sleep. MAKE SURE YOU:   Understand these instructions.  Will watch your condition.  Will get help right away if you are not doing well or get worse.   This information is not intended to replace advice given to you by your health care provider. Make sure you discuss any questions you have with your health care provider.   Document Released: 09/27/2008 Document Revised: 11/05/2014 Document Reviewed: 06/22/2013 Elsevier Interactive Patient Education [employer].

## 2016-02-17 NOTE — ED Provider Notes (Signed)
[facility] Emergency Department Provider Note  ____________________________________________    I have reviewed the triage vital signs and the nursing notes.   HISTORY  Chief Complaint Chest Pain    HPI Connor Stone is a 45 y.o. male who presents with complaints of dizziness and syncope. Patient reports he was "horsing around" with his daughter and chasing her and she slipped in the kitchen and he moved quickly to avoid stepping on her and fell and struck his chest against the [employer]. He was very worried about his daughter because she had fallen hard and while he was tending to her he became lightheaded and apparently syncopized. He reports chest discomfort where his chest struck the [place] and mild posterior head pain where he hit his head when he syncopized. No History of heart disease.     Past Medical History  Diagnosis Date  . Kidney stones   . DVT (deep venous thrombosis) (HCC)     right leg  . Post-operative nausea and vomiting   . BRCA1 positive     Patient Active Problem List   Diagnosis Date Noted  . Rectal bleeding 05/26/2015  . Change in bowel habits 05/24/2015  . 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 Blood in stool 05/24/2015  . Genetic testing 05/10/2015  . Family history of malignant neoplasm of breast 03/21/2015  . Family history of BRCA1 gene positive 03/21/2015  . Family history of prostate cancer 03/21/2015  . Family history of BRCA gene positive 02/15/2015  . Nephrolithiasis 02/15/2015  . Visit for well man health check 02/15/2015    Past Surgical History  Procedure Laterality Date  . Knee arthroscopy w/ acl reconstruction Right     Current Outpatient Rx  Name  Route  Sig  Dispense  Refill  . dicyclomine (BENTYL) 20 MG tablet   Oral   Take 1 tablet (20 mg total) by mouth 2 (two) times daily.   60 tablet   3     Allergies Review of patient's allergies indicates no known allergies.  Family History  Problem  Relation Age of Onset  . Breast cancer Mother 72    bilateral breast cancer; d. 24; no genetic testing  . Prostate cancer Father 27    no genetic testing and is an only child with a mother with cancer  . Diabetes Father   . Breast cancer Sister 32     BRCA1 positive, sister is Diesel Lina (DOB 06/27/70) tested at [facility] with a report date of 01/13/2015. Result is positive for a pathogenic mutation called BRCA1, p.C61G.  . Cancer Paternal Grandmother 52    unknown type of rare cancer    Social History Social History  Substance Use Topics  . Smoking status: Never Smoker   . Smokeless tobacco: Never Used  . Alcohol Use: 0.0 oz/week    0 Standard drinks or equivalent per week     Comment: 2-4 per day    Review of Systems  Constitutional: Negative for fever. Eyes: Negative for redness ENT: Negative for sore throat Cardiovascular: Chest soreness Respiratory: Negative for shortness of breath. Gastrointestinal: Negative for abdominal pain  Musculoskeletal: Negative for extremity injury Skin: Negative for rash. Neurological: Negative for focal weakness Psychiatric: no anxiety    ____________________________________________   PHYSICAL EXAM:  VITAL SIGNS: ED Triage Vitals  Enc Vitals Group     BP 02/17/16 0105 140/90 mmHg     Pulse Rate 02/17/16 0105 83     Resp 02/17/16 0105 19  Temp 02/17/16 0105 98.6 F (37 C)     Temp Source 02/17/16 0105 Oral     SpO2 02/17/16 0105 100 %     Weight 02/17/16 0105 179 lb (81.194 kg)     Height 02/17/16 0105 6' (1.829 m)     Head Cir --      Peak Flow --      Pain Score 02/17/16 0109 8     Pain Loc --      Pain Edu? --      Excl. in [place]? --      Constitutional: Alert and oriented. Well appearing and in no distress.  Eyes: Conjunctivae are normal. No erythema or injection ENT   Head: Normocephalic and atraumatic.   Mouth/Throat: Mucous membranes are moist. Cardiovascular: Normal rate, regular rhythm. Normal and  symmetric distal pulses are present in the upper extremities. Mild discomfort in the upper chest just left of  the sternum, no bony abnormalities Respiratory: Normal respiratory effort without tachypnea nor retractions. Breath sounds are clear and equal bilaterally.  Gastrointestinal: Soft and non-tender in all quadrants. No distention. There is no CVA tenderness. Genitourinary: deferred Musculoskeletal: Nontender with normal range of motion in all extremities. No lower extremity tenderness nor edema. Neurologic:  Normal speech and language. No gross focal neurologic deficits are appreciated. Skin:  Skin is warm, dry and intact. No rash noted. Psychiatric: Mood and affect are normal. Patient exhibits appropriate insight and judgment.  ____________________________________________    LABS (pertinent positives/negatives)  Labs Reviewed  CBC  COMPREHENSIVE METABOLIC PANEL  TROPONIN I    ____________________________________________   EKG  ED ECG REPORT I, Lavonia Drafts, the attending physician, personally viewed and interpreted this ECG.  Date: 02/17/2016 EKG Time: 1:01 AM Rate: 85 Rhythm: normal sinus rhythm QRS Axis: normal Intervals: normal ST/T Wave abnormalities: ST elevation most consistent with early repolarization in V3 and V4 Conduction Disturbances: none    ____________________________________________    RADIOLOGY  Chest x-ray unremarkable CT head normal ____________________________________________   PROCEDURES  Procedure(s) performed: none  Critical Care performed: none  ____________________________________________   INITIAL IMPRESSION / ASSESSMENT AND PLAN / ED COURSE  Pertinent labs & imaging results that were available during my care of the patient were reviewed by me and considered in my medical decision making (see chart for details).  Patient presents after chest wall contusion and likely vasovagal episode. He is well-appearing here. Initial  lab work is reassuring. Vitals are reassuring. EKG is unremarkable. We will give IV fluids, check a second troponin and do orthostatics.  Patient's second troponin is normal, his orthostatics are normal. He feels well. I do feel this was a vasovagal episode related to the emotional stress and his daughter being injured and/or his own pain from his fall.  I discussed the patient's findings with he and his wife he agrees with discharge and will follow-up with PCP as needed. Return precautions discussed  ____________________________________________   FINAL CLINICAL IMPRESSION(S) / ED DIAGNOSES  Final diagnoses:  Chest wall contusion, unspecified laterality, initial encounter  Syncope and collapse  Head injury, initial encounter          Lavonia Drafts, MD 02/17/16 0522

## 2016-02-21 ENCOUNTER — Ambulatory Visit (INDEPENDENT_AMBULATORY_CARE_PROVIDER_SITE_OTHER): Payer: BC Managed Care – PPO | Admitting: Family Medicine

## 2016-02-21 ENCOUNTER — Encounter: Payer: Self-pay | Admitting: Family Medicine

## 2016-02-21 VITALS — BP 142/78 | HR 71 | Temp 98.1°F | Wt 175.8 lb

## 2016-02-21 DIAGNOSIS — S20219A Contusion of unspecified front wall of thorax, initial encounter: Secondary | ICD-10-CM | POA: Insufficient documentation

## 2016-02-21 DIAGNOSIS — R55 Syncope and collapse: Secondary | ICD-10-CM

## 2016-02-21 DIAGNOSIS — S20219D Contusion of unspecified front wall of thorax, subsequent encounter: Secondary | ICD-10-CM | POA: Diagnosis not present

## 2016-02-21 DIAGNOSIS — F0781 Postconcussional syndrome: Secondary | ICD-10-CM

## 2016-02-21 NOTE — Assessment & Plan Note (Signed)
Healing appropriately. Call or return to clinic prn if these symptoms worsen or fail to improve as anticipated. The patient indicates understanding of these issues and agrees with the plan.

## 2016-02-21 NOTE — Progress Notes (Signed)
Subjective:   Patient ID: Connor Stone, male    DOB: 1971-08-06, 45 y.o.   MRN: [id number]  Connor Stone is a pleasant 45 y.o. year old male who presents to clinic today with Hospitalization Follow-up and Results  on 02/21/2016  HPI:  ER follow up- notes reviewed.  Was seen at [facility] with complaint of dizziness and syncope.  Pt was playing with his daughter when he slipped in the kitchen and struck his chest against the [place].  Daughter had fallen too and while he was caring for her, he became lightheaded and had a syncopal episode.  Called EMS and had 4 more syncopal episodes- hit his head on the floor twice.  VSS in ED.  EKG, CBC, CMET and Troponin I all reassuring.  CXR and Head CT both unremarkable.  Feels better today but still has a headache and some dizziness.   Chest is also still sore.  No further syncopal episodes.  Diagnosed with chest wall contusion and vasovagal syncope.  Given IV fluids, and d/c'd home once orthostatics were normal. Ct Head Wo Contrast  02/17/2016  CLINICAL DATA:  [place] and struck back of head. Headache and chest pain. EXAM: CT HEAD WITHOUT CONTRAST TECHNIQUE: Contiguous axial images were obtained from the base of the skull through the vertex without intravenous contrast. COMPARISON:  None. FINDINGS: Ventricles and sulci appear symmetrical. No ventricular dilatation. No mass effect or midline shift. No abnormal extra-axial fluid collections. Gray-white matter junctions are distinct. Basal cisterns are not effaced. No evidence of acute intracranial hemorrhage. No depressed skull fractures. Visualized paranasal sinuses and mastoid air cells are not opacified. IMPRESSION: No acute intracranial abnormalities. Electronically Signed   By: Lucienne Capers M.D.   On: 02/17/2016 02:02   Dg Chest Portable 1 View  02/17/2016  CLINICAL DATA:  Chest pain after fall. Central/left-sided pain. Fall into kitchen column. EXAM: PORTABLE CHEST 1 VIEW COMPARISON:  None.  FINDINGS: The cardiomediastinal contours are normal. The lungs are clear. Pulmonary vasculature is normal. No consolidation, pleural effusion, or pneumothorax. No acute osseous abnormalities are seen. IMPRESSION: No acute process. Electronically Signed   By: Jeb Levering M.D.   On: 02/17/2016 02:08     Current Outpatient Prescriptions on File Prior to Visit  Medication Sig Dispense Refill  . HYDROcodone-acetaminophen (NORCO/VICODIN) 5-325 MG tablet Take 1 tablet by mouth every 4 (four) hours as needed for moderate pain. 20 tablet 0  . naproxen (NAPROSYN) 500 MG tablet Take 1 tablet (500 mg total) by mouth 2 (two) times daily with a meal. 20 tablet 2   No current facility-administered medications on file prior to visit.    No Known Allergies  Past Medical History  Diagnosis Date  . Kidney stones   . DVT (deep venous thrombosis) (HCC)     right leg  . Post-operative nausea and vomiting   . BRCA1 positive     Past Surgical History  Procedure Laterality Date  . Knee arthroscopy w/ acl reconstruction Right     Family History  Problem Relation Age of Onset  . Breast cancer Mother 70    bilateral breast cancer; d. 61; no genetic testing  . Prostate cancer Father 60    no genetic testing and is an only child with a mother with cancer  . Diabetes Father   . Breast cancer Sister 45     BRCA1 positive, sister is Connor Stone (DOB 06/27/70) tested at [place] with a report date of 01/13/2015. Result is positive  for a pathogenic mutation called BRCA1, p.C61G.  . Cancer Paternal Grandmother 50    unknown type of rare cancer    Social History   Social History  . Marital Status: Married    Spouse Name: N/A  . Number of Children: 1  . Years of Education: N/A   Occupational History  . scholl teacher    Social History Main Topics  . Smoking status: Never Smoker   . Smokeless tobacco: Never Used  . Alcohol Use: 0.0 oz/week    0 Standard drinks or equivalent per week     Comment:  2-4 per day  . Drug Use: No  . Sexual Activity: Yes   Other Topics Concern  . Not on file   Social History Narrative   Married   PE teacher in middle school   45 year old daughter   Brother is an Therapist, art   The PMH, PSH, Social History, Family History, Medications, and allergies have been reviewed in [facility], and have been updated if relevant.     Review of Systems  Constitutional: Negative.   Eyes: Negative.   Respiratory: Negative.   Cardiovascular: Positive for chest pain. Negative for palpitations and leg swelling.  Gastrointestinal: Negative.   Endocrine: Negative.   Genitourinary: Negative.   Musculoskeletal: Negative.   Skin: Negative.   Allergic/Immunologic: Negative.   Neurological: Positive for dizziness and headaches. Negative for light-headedness.  Hematological: Negative.   Psychiatric/Behavioral: Negative.   All other systems reviewed and are negative.      Objective:    BP 142/78 mmHg  Pulse 71  Temp(Src) 98.1 F (36.7 C) (Oral)  Wt 175 lb 12 oz (79.72 kg)  SpO2 97%   Physical Exam  Constitutional: He is oriented to person, place, and time. He appears well-developed and well-nourished. No distress.  HENT:  Head: Normocephalic and atraumatic.  Eyes: Conjunctivae are normal.  Cardiovascular: Normal rate and regular rhythm.   Pulmonary/Chest: Effort normal and breath sounds normal.  Musculoskeletal: Normal range of motion.  Neurological: He is alert and oriented to person, place, and time. No cranial nerve deficit.  Skin: Skin is warm and dry. He is not diaphoretic.  Psychiatric: He has a normal mood and affect. His behavior is normal. Judgment and thought content normal.  Vitals reviewed.         Assessment & Plan:   Vasovagal syncope  Chest wall contusion, unspecified laterality, subsequent encounter No Follow-up on file.

## 2016-02-21 NOTE — Assessment & Plan Note (Signed)
New- advised brain rest. Given excuse to keep him out of work this week. See AVS for details. Call or return to clinic prn if these symptoms worsen or fail to improve as anticipated. The patient indicates understanding of these issues and agrees with the plan.

## 2016-02-21 NOTE — Progress Notes (Signed)
Pre visit review using our clinic review tool, if applicable. No additional management support is needed unless otherwise documented below in the visit note. 

## 2016-02-21 NOTE — Assessment & Plan Note (Signed)
New- resolved. Agree likely vasovagal due to pain and emotional concern for his daughter. No recurrent symptoms. No further work up warranted at this time.

## 2016-02-21 NOTE — Patient Instructions (Signed)
Post-Concussion Syndrome  Post-concussion syndrome describes the symptoms that can occur after a head injury. These symptoms can last from weeks to months.  CAUSES   It is not clear why some head injuries cause post-concussion syndrome. It can occur whether your head injury was mild or severe and whether you were wearing head protection or not.   SIGNS AND SYMPTOMS  · Memory difficulties.  · Dizziness.  · Headaches.  · Double vision or blurry vision.  · Sensitivity to light.  · Hearing difficulties.  · Depression.  · Tiredness.  · Weakness.  · Difficulty with concentration.  · Difficulty sleeping or staying asleep.  · Vomiting.  · Poor balance or instability on your feet.  · Slow reaction time.  · Difficulty learning and remembering things you have heard.  DIAGNOSIS   There is no test to determine whether you have post-concussion syndrome. Your health care provider may order an imaging scan of your brain, such as a CT scan, to check for other problems that may be causing your symptoms (such as a severe injury inside your skull).  TREATMENT   Usually, these problems disappear over time without medical care. Your health care provider may prescribe medicine to help ease your symptoms. It is important to follow up with a neurologist to evaluate your recovery and address any lingering symptoms or issues.  HOME CARE INSTRUCTIONS   · Take medicines only as directed by your health care provider. Do not take aspirin. Aspirin can slow blood clotting.  · Sleep with your head slightly elevated to help with headaches.  · Avoid any situation where there is potential for another head injury. This includes football, hockey, soccer, basketball, martial arts, downhill snow sports, and horseback riding. Your condition will get worse every time you experience a concussion. You should avoid these activities until you are evaluated by the appropriate follow-up health care providers.  · Keep all follow-up visits as directed by your health  care provider. This is important.  SEEK MEDICAL CARE IF:  · You have increased problems paying attention or concentrating.  · You have increased difficulty remembering or learning new information.  · You need more time to complete tasks or assignments than before.  · You have increased irritability or decreased ability to cope with stress.  · You have more symptoms than before.  Seek medical care if you have any of the following symptoms for more than two weeks after your injury:  · Lasting (chronic) headaches.  · Dizziness or balance problems.  · Nausea.  · Vision problems.  · Increased sensitivity to noise or light.  · Depression or mood swings.  · Anxiety or irritability.  · Memory problems.  · Difficulty concentrating or paying attention.  · Sleep problems.  · Feeling tired all the time.  SEEK IMMEDIATE MEDICAL CARE IF:  · You have confusion or unusual drowsiness.  · Others find it difficult to wake you up.  · You have nausea or persistent, forceful vomiting.  · You feel like you are moving when you are not (vertigo). Your eyes may move rapidly back and forth.  · You have convulsions or faint.  · You have severe, persistent headaches that are not relieved by medicine.  · You cannot use your arms or legs normally.  · One of your pupils is larger than the other.  · You have clear or bloody discharge from your nose or ears.  · Your problems are getting worse, not better.  MAKE   SURE YOU:  · Understand these instructions.  · Will watch your condition.  · Will get help right away if you are not doing well or get worse.     This information is not intended to replace advice given to you by your health care provider. Make sure you discuss any questions you have with your health care provider.     Document Released: 04/06/2002 Document Revised: 11/05/2014 Document Reviewed: 01/20/2014  Elsevier Interactive Patient Education ©2016 [employer] Inc[employer]

## 2016-03-05 ENCOUNTER — Inpatient Hospital Stay
Admission: EM | Admit: 2016-03-05 | Discharge: 2016-03-08 | DRG: 392 | Disposition: A | Discharge status: Home / Self Care | Payer: BC Managed Care – PPO | Attending: Surgery | Admitting: Surgery

## 2016-03-05 ENCOUNTER — Ambulatory Visit (INDEPENDENT_AMBULATORY_CARE_PROVIDER_SITE_OTHER): Payer: BC Managed Care – PPO | Admitting: Internal Medicine

## 2016-03-05 ENCOUNTER — Ambulatory Visit
Admission: RE | Admit: 2016-03-05 | Discharge: 2016-03-05 | Disposition: A | Discharge status: Home / Self Care | Payer: BC Managed Care – PPO | Source: Ambulatory Visit | Attending: Internal Medicine | Admitting: Internal Medicine

## 2016-03-05 ENCOUNTER — Ambulatory Visit: Payer: BC Managed Care – PPO | Admitting: Family Medicine

## 2016-03-05 ENCOUNTER — Encounter: Payer: Self-pay | Admitting: Emergency Medicine

## 2016-03-05 ENCOUNTER — Encounter: Payer: Self-pay | Admitting: Internal Medicine

## 2016-03-05 ENCOUNTER — Other Ambulatory Visit: Payer: Self-pay

## 2016-03-05 VITALS — BP 140/82 | HR 89 | Temp 99.5°F | Wt 178.8 lb

## 2016-03-05 DIAGNOSIS — Z803 Family history of malignant neoplasm of breast: Secondary | ICD-10-CM | POA: Diagnosis not present

## 2016-03-05 DIAGNOSIS — R1032 Left lower quadrant pain: Secondary | ICD-10-CM

## 2016-03-05 DIAGNOSIS — K5720 Diverticulitis of large intestine with perforation and abscess without bleeding: Principal | ICD-10-CM | POA: Diagnosis present

## 2016-03-05 DIAGNOSIS — Z79899 Other long term (current) drug therapy: Secondary | ICD-10-CM | POA: Diagnosis not present

## 2016-03-05 DIAGNOSIS — Z87442 Personal history of urinary calculi: Secondary | ICD-10-CM | POA: Diagnosis not present

## 2016-03-05 DIAGNOSIS — Z9889 Other specified postprocedural states: Secondary | ICD-10-CM

## 2016-03-05 DIAGNOSIS — Z8042 Family history of malignant neoplasm of prostate: Secondary | ICD-10-CM | POA: Diagnosis not present

## 2016-03-05 DIAGNOSIS — Z1501 Genetic susceptibility to malignant neoplasm of breast: Secondary | ICD-10-CM

## 2016-03-05 DIAGNOSIS — K630 Abscess of intestine: Secondary | ICD-10-CM | POA: Insufficient documentation

## 2016-03-05 DIAGNOSIS — Z833 Family history of diabetes mellitus: Secondary | ICD-10-CM

## 2016-03-05 DIAGNOSIS — N200 Calculus of kidney: Secondary | ICD-10-CM | POA: Insufficient documentation

## 2016-03-05 DIAGNOSIS — R1031 Right lower quadrant pain: Secondary | ICD-10-CM | POA: Diagnosis not present

## 2016-03-05 DIAGNOSIS — K5732 Diverticulitis of large intestine without perforation or abscess without bleeding: Secondary | ICD-10-CM

## 2016-03-05 LAB — CBC WITH DIFFERENTIAL/PLATELET
Basophils Absolute: 0 10*3/uL (ref 0–0.1)
EOS ABS: 0 10*3/uL (ref 0–0.7)
Eosinophils Relative: 0 %
HEMATOCRIT: 40.6 % (ref 40.0–52.0)
HEMOGLOBIN: 13.9 g/dL (ref 13.0–18.0)
Lymphocytes Relative: 6 %
Lymphs Abs: 1.2 10*3/uL (ref 1.0–3.6)
MCH: 30.4 pg (ref 26.0–34.0)
MCHC: 34.2 g/dL (ref 32.0–36.0)
MCV: 88.7 fL (ref 80.0–100.0)
Monocytes Absolute: 1.3 10*3/uL — ABNORMAL HIGH (ref 0.2–1.0)
NEUTROS ABS: 16 10*3/uL — AB (ref 1.4–6.5)
Platelets: 210 10*3/uL (ref 150–440)
RBC: 4.58 MIL/uL (ref 4.40–5.90)
RDW: 12.8 % (ref 11.5–14.5)
WBC: 18.6 10*3/uL — AB (ref 3.8–10.6)

## 2016-03-05 LAB — COMPREHENSIVE METABOLIC PANEL
ALBUMIN: 4.1 g/dL (ref 3.5–5.0)
ALT: 17 U/L (ref 17–63)
ANION GAP: 11 (ref 5–15)
AST: 15 U/L (ref 15–41)
Alkaline Phosphatase: 57 U/L (ref 38–126)
BILIRUBIN TOTAL: 1.2 mg/dL (ref 0.3–1.2)
BUN: 10 mg/dL (ref 6–20)
CHLORIDE: 100 mmol/L — AB (ref 101–111)
CO2: 24 mmol/L (ref 22–32)
Calcium: 8.7 mg/dL — ABNORMAL LOW (ref 8.9–10.3)
Creatinine, Ser: 0.86 mg/dL (ref 0.61–1.24)
GFR calc Af Amer: >60 mL/min (ref >60)
GFR calc non Af Amer: >60 mL/min (ref >60)
GLUCOSE: 94 mg/dL (ref 65–99)
POTASSIUM: 4 mmol/L (ref 3.5–5.1)
SODIUM: 135 mmol/L (ref 135–145)
Total Protein: 7.2 g/dL (ref 6.5–8.1)

## 2016-03-05 LAB — ABO/RH: ABO/RH(D): A POS

## 2016-03-05 LAB — MAGNESIUM: MAGNESIUM: 1.6 mg/dL — AB (ref 1.7–2.4)

## 2016-03-05 LAB — PROTIME-INR
INR: 0.97
Prothrombin Time: 13.1 seconds (ref 11.4–15.0)

## 2016-03-05 LAB — TYPE AND SCREEN
ABO/RH(D): A POS
ANTIBODY SCREEN: NEGATIVE

## 2016-03-05 LAB — APTT: APTT: 30 s (ref 24–36)

## 2016-03-05 LAB — ETHANOL

## 2016-03-05 MED ORDER — OXYCODONE HCL 5 MG PO TABS: 5.0000 mg | ORAL_TABLET | ORAL | Status: DC | PRN

## 2016-03-05 MED ORDER — MORPHINE SULFATE (PF) 2 MG/ML IV SOLN: 2.0000 mg | INTRAVENOUS | Status: DC | PRN

## 2016-03-05 MED ORDER — IOPAMIDOL (ISOVUE-300) INJECTION 61%
100.0000 mL | Freq: Once | INTRAVENOUS | Status: AC | PRN
  Administered 2016-03-05: 100 mL via INTRAVENOUS

## 2016-03-05 MED ORDER — ONDANSETRON 4 MG PO TBDP
4.0000 mg | ORAL_TABLET | Freq: Four times a day (QID) | ORAL | Status: DC | PRN
  Filled 2016-03-05: qty 1

## 2016-03-05 MED ORDER — ENOXAPARIN SODIUM 40 MG/0.4ML ~~LOC~~ SOLN
40.0000 mg | SUBCUTANEOUS | Status: DC
  Administered 2016-03-05 – 2016-03-07 (×3): 40 mg via SUBCUTANEOUS
  Filled 2016-03-05 (×2): qty 0.4

## 2016-03-05 MED ORDER — PIPERACILLIN-TAZOBACTAM 3.375 G IVPB 30 MIN
3.3750 g | Freq: Once | INTRAVENOUS | Status: AC
  Administered 2016-03-05: 3.375 g via INTRAVENOUS
  Filled 2016-03-05: qty 50

## 2016-03-05 MED ORDER — DIPHENHYDRAMINE HCL 12.5 MG/5ML PO ELIX: 12.5000 mg | ORAL_SOLUTION | Freq: Four times a day (QID) | ORAL | Status: DC | PRN

## 2016-03-05 MED ORDER — ONDANSETRON HCL 4 MG/2ML IJ SOLN: 4.0000 mg | Freq: Four times a day (QID) | INTRAMUSCULAR | Status: DC | PRN

## 2016-03-05 MED ORDER — ENOXAPARIN SODIUM 40 MG/0.4ML ~~LOC~~ SOLN
SUBCUTANEOUS | Status: AC
  Filled 2016-03-05: qty 0.4

## 2016-03-05 MED ORDER — PIPERACILLIN-TAZOBACTAM 3.375 G IVPB
3.3750 g | Freq: Three times a day (TID) | INTRAVENOUS | Status: DC
  Administered 2016-03-05 – 2016-03-08 (×8): 3.375 g via INTRAVENOUS
  Filled 2016-03-05 (×9): qty 50

## 2016-03-05 MED ORDER — SODIUM CHLORIDE 0.9 % IV SOLN
Freq: Once | INTRAVENOUS | Status: AC
  Administered 2016-03-05: 16:00:00 via INTRAVENOUS

## 2016-03-05 MED ORDER — KETOROLAC TROMETHAMINE 60 MG/2ML IM SOLN
60.0000 mg | Freq: Once | INTRAMUSCULAR | Status: AC
  Administered 2016-03-05: 60 mg via INTRAMUSCULAR

## 2016-03-05 MED ORDER — PANTOPRAZOLE SODIUM 40 MG IV SOLR
40.0000 mg | Freq: Every day | INTRAVENOUS | Status: DC
  Administered 2016-03-05 – 2016-03-07 (×3): 40 mg via INTRAVENOUS
  Filled 2016-03-05 (×3): qty 40

## 2016-03-05 MED ORDER — KETOROLAC TROMETHAMINE 15 MG/ML IJ SOLN
15.0000 mg | Freq: Four times a day (QID) | INTRAMUSCULAR | Status: DC
  Administered 2016-03-06 – 2016-03-08 (×10): 15 mg via INTRAVENOUS
  Filled 2016-03-05 (×10): qty 1

## 2016-03-05 MED ORDER — KETOROLAC TROMETHAMINE 30 MG/ML IJ SOLN
INTRAMUSCULAR | Status: AC
  Administered 2016-03-05: 15 mg
  Filled 2016-03-05: qty 1

## 2016-03-05 MED ORDER — DEXTROSE IN LACTATED RINGERS 5 % IV SOLN
INTRAVENOUS | Status: DC
  Administered 2016-03-05 – 2016-03-07 (×4): via INTRAVENOUS

## 2016-03-05 MED ORDER — DIPHENHYDRAMINE HCL 50 MG/ML IJ SOLN: 12.5000 mg | Freq: Four times a day (QID) | INTRAMUSCULAR | Status: DC | PRN

## 2016-03-05 NOTE — ED Notes (Signed)
Pt alert.  Pt talking on cell pphone and watching tv   Iv fluids infusing.

## 2016-03-05 NOTE — ED Notes (Signed)
Reports abd pain since last Wednesday.  Reports mucus noted in his stool. Sent for CT scan ordered by primary MD and reports  'small tear in colon".  Skin w/d with good color, NAD

## 2016-03-05 NOTE — ED Notes (Signed)
Pt states he has lower abd pain with diarrhea.  No bloody stools today.  States etoh every day.  Pt sent to er for eval after outpt ct scan today.  Iv in place.  Pt alert.  family with pt

## 2016-03-05 NOTE — Progress Notes (Signed)
Pre visit review using our clinic review tool, if applicable. No additional management support is needed unless otherwise documented below in the visit note. 

## 2016-03-05 NOTE — H&P (Signed)
Patient ID: Connor Stone, male   DOB: 04/23/1971, 45 y.o.   MRN: [id number]  History of Present Illness Connor Stone is a 45 y.o. male in the emergency room for lower abdominal pain. Patient reports he started having abdominal pain about 5 days ago. Now progressively getting worse. He describes the pain as a crampy intermittent severe pain suprapubic area and in the right lower quadrant. Worsening with movement. No specific out of eating factors. No fevers or chills. She does report some mucus on his stool. Apparently he has had milder episodes of diverticulitis over the last year. He did have a recent colonoscopy by could not oral GI October 2016 revealed multiple polyps recommended colonoscopy in 3 years was advised. There was also evidence of diverticulosis. He does have a history of BRCA 1 wth a mother and sister  Diagnosed with BREAST cancer. The scan personally reviewed there is evidence of diverticulitis with diverticular abscess. This appears to be intramural or pre-deep. There is not a definitive window for percutaneous drainage and it is less than 3 cm in size. There is no evidence of free air.  Past Medical History Past Medical History  Diagnosis Date  . Kidney stones   . DVT (deep venous thrombosis) (HCC)     right leg  . Post-operative nausea and vomiting   . BRCA1 positive      Past Surgical History  Procedure Laterality Date  . Knee arthroscopy w/ acl reconstruction Right     No Known Allergies   Current Facility-Administered Medications  Medication Dose Route Frequency Provider Last Rate Last Dose  . dextrose 5 % in lactated ringers infusion   Intravenous Continuous Connor F Pabon, MD      . diphenhydrAMINE (BENADRYL) 12.5 MG/5ML elixir 12.5 mg  12.5 mg Oral Q6H PRN Connor F Pabon, MD       Or  . diphenhydrAMINE (BENADRYL) injection 12.5 mg  12.5 mg Intravenous Q6H PRN Connor F Pabon, MD      . enoxaparin (LOVENOX) injection 40 mg  40 mg Subcutaneous Q24H Connor F Pabon, MD       . ketorolac (TORADOL) 15 MG/ML injection 15 mg  15 mg Intravenous Q6H Connor F Pabon, MD      . morphine 2 MG/ML injection 2 mg  2 mg Intravenous Q2H PRN Connor F Pabon, MD      . ondansetron (ZOFRAN-ODT) disintegrating tablet 4 mg  4 mg Oral Q6H PRN Connor F Pabon, MD       Or  . ondansetron (ZOFRAN) injection 4 mg  4 mg Intravenous Q6H PRN Connor F Pabon, MD      . oxyCODONE (Oxy IR/ROXICODONE) immediate release tablet 5-10 mg  5-10 mg Oral Q4H PRN Connor F Pabon, MD      . pantoprazole (PROTONIX) injection 40 mg  40 mg Intravenous QHS Connor Husbands, MD       Current Outpatient Prescriptions  Medication Sig Dispense Refill  . dicyclomine (BENTYL) 20 MG tablet Take 20 mg by mouth 2 (two) times daily as needed (for cramping).    Marland Kitchen HYDROcodone-acetaminophen (NORCO/VICODIN) 5-325 MG tablet Take 1 tablet by mouth every 4 (four) hours as needed for moderate pain. 20 tablet 0  . ibuprofen (ADVIL,MOTRIN) 200 MG tablet Take 400-600 mg by mouth every 6 (six) hours as needed for headache or mild pain.    . naproxen (NAPROSYN) 500 MG tablet Take 500 mg by mouth 2 (two) times daily as needed for mild pain.  Facility-Administered Medications Ordered in Other Encounters  Medication Dose Route Frequency Provider Last Rate Last Dose  . ketorolac (TORADOL) injection 60 mg  60 mg Intramuscular Once Jearld Fenton, NP        Family History Family History  Problem Relation Age of Onset  . Breast cancer Mother 69    bilateral breast cancer; d. 7; no genetic testing  . Prostate cancer Father 14    no genetic testing and is an only child with a mother with cancer  . Diabetes Father   . Breast cancer Sister 51     BRCA1 positive, sister is Connor Stone (DOB 06/27/70) tested at [place] with a report date of 01/13/2015. Result is positive for a pathogenic mutation called BRCA1, p.C61G.  . Cancer Paternal Grandmother 64    unknown type of rare cancer     Social History Social History  Substance Use Topics   . Smoking status: Never Smoker   . Smokeless tobacco: Never Used  . Alcohol Use: 0.0 oz/week    0 Standard drinks or equivalent per week     Comment: 2-4 per day       ROS 10 pt ROS was performed and is otherwise negative   Physical Exam Blood pressure 163/95, pulse 93, temperature 98 F (36.7 C), temperature source Oral, resp. rate 20, height 6' (1.829 m), weight 80.74 kg (178 lb), SpO2 100 %.  CONSTITUTIONAL: NAD, non toxic EYES: Pupils equal, round, and reactive to light, Sclera non-icteric. EARS, NOSE, MOUTH AND THROAT: The oropharynx is clear. Oral mucosa is pink and moist. Hearing is intact to voice.  NECK: Trachea is midline, and there is no jugular venous distension. Thyroid is without palpable abnormalities. LYMPH NODES:  Lymph nodes in the neck are not enlarged. RESPIRATORY:  Lungs are clear, and breath sounds are equal bilaterally. Normal respiratory effort without pathologic use of accessory muscles. CARDIOVASCULAR: Heart is regular without murmurs, gallops, or rubs. GI: The abdomen is  soft, mild tenderness Suprapubic area and LLQ,No peritonitis There were no palpable masses. There was no hepatosplenomegaly. There were normal bowel sounds. GU: Deferred MUSCULOSKELETAL:  Normal muscle strength and tone in all four extremities.    SKIN: Skin turgor is normal. There are no pathologic skin lesions.  NEUROLOGIC:  Motor and sensation is grossly normal.  Cranial nerves are grossly intact. PSYCH:  Alert and oriented to person, place and time. Affect is normal.  Data Reviewed  I have personally reviewed the patient's imaging and medical records.    Assessment/Plan Complicated diverticular abscess. Patient without peritonitis given the location of the abscess and his clinical scenario he'll benefit from admission with IV antibiotics and nothing by mouth. We'll start him on IV fluids keep her nothing by mouth. He may need another rescan her percutaneous drain is his symptoms do  not improve. Discussed with him in detail and about the benefit of an elective sigmoid colectomy as an outpatient once his symptoms resolve.Extensive counseling provided to the patient. No need for immediate surgical intervention. I also discussed with him the small chance of Hartmann procedure as a worse case scenario in case he does not improve, he understands.   Caroleen Hamman, MD [place] 03/05/2016, 5:13 PM

## 2016-03-05 NOTE — Addendum Note (Signed)
Addended by: Lurlean Nanny on: 03/05/2016 05:13 PM   Modules accepted: Orders

## 2016-03-05 NOTE — Progress Notes (Signed)
Subjective:    Patient ID: Connor Stone, male    DOB: 19-Dec-1970, 45 y.o.   MRN: [id number]  HPI  Pt presents to the clinic today with c/o lower abdominal pain. This started 4 days ago but he reports he has had this pain intermittently for at least a year. He describes the pain as sharp and stabbing. He denies nausea and vomiting. He does intermittently have loose stool, but reports this time, it feels like he can not have a bowel movement, like "something is blocking it". He did pass a small amount of loose stool and "saliva" this morning. He denies blood in his stool. He has tried taking Bentyl without any relief. He also took a stool softener without any relief. He has seen GI in the past. Colonoscopy showed diverticulosis with some mild colitis (never treated) and he had a precancerous polyp removed. He has not been back to see GI since his colonoscopy. He has had fever and chills.  Review of Systems      Past Medical History  Diagnosis Date  . Kidney stones   . DVT (deep venous thrombosis) (HCC)     right leg  . Post-operative nausea and vomiting   . BRCA1 positive     Current Outpatient Prescriptions  Medication Sig Dispense Refill  . dicyclomine (BENTYL) 20 MG tablet Take 20 mg by mouth 2 (two) times daily.    Marland Kitchen HYDROcodone-acetaminophen (NORCO/VICODIN) 5-325 MG tablet Take 1 tablet by mouth every 4 (four) hours as needed for moderate pain. 20 tablet 0  . naproxen (NAPROSYN) 500 MG tablet Take 1 tablet (500 mg total) by mouth 2 (two) times daily with a meal. 20 tablet 2   No current facility-administered medications for this visit.    No Known Allergies  Family History  Problem Relation Age of Onset  . Breast cancer Mother 74    bilateral breast cancer; d. 41; no genetic testing  . Prostate cancer Father 33    no genetic testing and is an only child with a mother with cancer  . Diabetes Father   . Breast cancer Sister 2     BRCA1 positive, sister is Bryden Darden  (DOB 06/27/70) tested at [place] with a report date of 01/13/2015. Result is positive for a pathogenic mutation called BRCA1, p.C61G.  . Cancer Paternal Grandmother 64    unknown type of rare cancer    Social History   Social History  . Marital Status: Married    Spouse Name: N/A  . Number of Children: 1  . Years of Education: N/A   Occupational History  . scholl teacher    Social History Main Topics  . Smoking status: Never Smoker   . Smokeless tobacco: Never Used  . Alcohol Use: 0.0 oz/week    0 Standard drinks or equivalent per week     Comment: 2-4 per day  . Drug Use: No  . Sexual Activity: Yes   Other Topics Concern  . 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 Denies malaise, fatigue, headache or abrupt weight changes.  Respiratory: Denies difficulty breathing, shortness of breath, cough or sputum production.   Cardiovascular: Denies chest pain, chest tightness, palpitations or swelling in the hands or feet.  Gastrointestinal: Pt reports abdominal pain. Denies bloating, constipation, diarrhea or blood  in the stool.  GU: Denies urgency, frequency, pain with urination, burning sensation, blood in urine, odor or discharge.  No other specific complaints in a complete review of systems (except as listed in HPI above).  Objective:   Physical Exam   BP 140/82 mmHg  Pulse 89  Temp(Src) 99.5 F (37.5 C) (Oral)  Wt 178 lb 12 oz (81.08 kg)  SpO2 99% Wt Readings from Last 3 Encounters:  03/05/16 178 lb 12 oz (81.08 kg)  02/21/16 175 lb 12 oz (79.72 kg)  02/17/16 179 lb (81.194 kg)    General: Appears his stated age, appears to be in pain. Cardiovascular: Normal rate and rhythm. S1,S2 noted.  No murmur, rubs or gallops noted.  Pulmonary/Chest: Normal effort and positive vesicular breath sounds. No respiratory distress. No wheezes, rales or  ronchi noted.  Abdomen: Soft and tender in the LLQ/RLQ. Tender in the suprapubic area. Normal bowel sounds. No distention or masses noted. Liver, spleen and kidneys non palpable. Rectal: Normal rectal tone. No internal mass noted.  BMET    Component Value Date/Time   NA 135 02/17/2016 0103   K 3.6 02/17/2016 0103   CL 104 02/17/2016 0103   CO2 25 02/17/2016 0103   GLUCOSE 112* 02/17/2016 0103   BUN 17 02/17/2016 0103   CREATININE 0.86 02/17/2016 0103   CALCIUM 8.3* 02/17/2016 0103   GFRNONAA >60 02/17/2016 0103   GFRAA >60 02/17/2016 0103    Lipid Panel     Component Value Date/Time   CHOL 185 02/15/2015 1156   TRIG 76.0 02/15/2015 1156   HDL 49.70 02/15/2015 1156   CHOLHDL 4 02/15/2015 1156   VLDL 15.2 02/15/2015 1156   LDLCALC 120* 02/15/2015 1156    CBC    Component Value Date/Time   WBC 7.9 02/17/2016 0103   RBC 4.28* 02/17/2016 0103   HGB 13.6 02/17/2016 0103   HCT 38.1* 02/17/2016 0103   PLT 179 02/17/2016 0103   MCV 88.9 02/17/2016 0103   MCH 31.7 02/17/2016 0103   MCHC 35.6 02/17/2016 0103   RDW 13.2 02/17/2016 0103   LYMPHSABS 1.6 02/15/2015 1156   MONOABS 0.5 02/15/2015 1156   EOSABS 0.0 02/15/2015 1156   BASOSABS 0.0 02/15/2015 1156    Hgb A1C No results found for: HGBA1C      Assessment & Plan:   Bilateral lower quadrant abdominal pain:  Recent CMET reviewed Concern for appendicitis versus diverticulitis STAT CT abdomen with contrast 60 mg Toradol IM today  Will call you with the results, to ER if pain acutely worsens

## 2016-03-05 NOTE — Progress Notes (Signed)
ANTIBIOTIC CONSULT NOTE - INITIAL  Pharmacy Consult for Zosyn  Indication: complicated diverticulitis with abscess  No Known Allergies  Patient Measurements: Height: 6' (182.9 cm) Weight: 178 lb (80.74 kg) IBW/kg (Calculated) : 77.6 Adjusted Body Weight:   Vital Signs: Temp: 98.6 F (37 C) (05/08 1848) Temp Source: Oral (05/08 1848) BP: 151/90 mmHg (05/08 1848) Pulse Rate: 80 (05/08 1848) Intake/Output from previous day:   Intake/Output from this shift:    Labs:  Recent Labs  03/05/16 1426  WBC 18.6*  HGB 13.9  PLT 210  CREATININE 0.86   Estimated Creatinine Clearance: 120.3 mL/min (by C-G formula based on Cr of 0.86). No results for input(s): VANCOTROUGH, VANCOPEAK, VANCORANDOM, GENTTROUGH, GENTPEAK, GENTRANDOM, TOBRATROUGH, TOBRAPEAK, TOBRARND, AMIKACINPEAK, AMIKACINTROU, AMIKACIN in the last 72 hours.   Microbiology: No results found for this or any previous visit (from the past 720 hour(s)).  Medical History: Past Medical History  Diagnosis Date  . Kidney stones   . DVT (deep venous thrombosis) (HCC)     right leg  . Post-operative nausea and vomiting   . BRCA1 positive     Medications:  Scheduled:  . enoxaparin      . enoxaparin (LOVENOX) injection  40 mg Subcutaneous Q24H  . ketorolac  15 mg Intravenous Q6H  . pantoprazole (PROTONIX) IV  40 mg Intravenous QHS  . piperacillin-tazobactam (ZOSYN)  IV  3.375 g Intravenous Q8H   Assessment: CrCl = 120.3 ml/min   Goal of Therapy:  resolution of infection  Plan:  Expected duration 7 days with resolution of temperature and/or normalization of WBC   Zosyn 3.375 gm IV X 1 given on 5/8 @ 16:06.  Zosyn 3.375 gm IV Q8H EI ordered to start 5/9 @ 22:00.   Connor Stone D 03/05/2016,6:56 PM

## 2016-03-05 NOTE — ED Provider Notes (Signed)
[facility] Emergency Department Provider Note  ____________________________________________  Time seen: Approximately 3:39 PM  I have reviewed the triage vital signs and the nursing notes.   HISTORY  Chief Complaint Abdominal Pain    HPI Connor Stone is a 45 y.o. male with no significant past medical history other than diverticulosis which was diagnosed last year on colonoscopy who presents with about 5 days of gradually worsening lower abdominal pain.  He reports that he frequently has lower abdominal cramps and a first she thought that was all this was, but it is gotten steadily worse and is now severe, sharp, and stabbing.  Over the last 2 days he has also had no appetite and he has had multiple mucous-like diarrhea episodes.  He has not had any significant nausea nor vomiting.  He denies chest pain, shortness of breath, fever, chills, dysuria.  Movement makes the pain in his abdomen worse and rest makes it a little bit better.  He did take some Norco that he had at home which alleviated some of the discomfort but then it came back again.  It was commented in the triage note that he drinks alcohol daily, but he clarified that he typically has 2-3 normal sized light beers per day, nothing more.  Past Medical History  Diagnosis Date  . Kidney stones   . DVT (deep venous thrombosis) (HCC)     right leg  . Post-operative nausea and vomiting   . BRCA1 positive     Patient Active Problem List   Diagnosis Date Noted  . Syncope 02/21/2016  . Chest wall contusion 02/21/2016  . 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 Rectal bleeding 05/26/2015  . Change in bowel habits 05/24/2015  . 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 Blood in stool 05/24/2015  . Genetic testing 05/10/2015  . Family history of malignant neoplasm of breast 03/21/2015  . Family history of BRCA1 gene positive 03/21/2015  . Family history of prostate cancer 03/21/2015  . Family history of BRCA gene  positive 02/15/2015  . Nephrolithiasis 02/15/2015    Past Surgical History  Procedure Laterality Date  . Knee arthroscopy w/ acl reconstruction Right     Current Outpatient Rx  Name  Route  Sig  Dispense  Refill  . dicyclomine (BENTYL) 20 MG tablet   Oral   Take 20 mg by mouth 2 (two) times daily.         Marland Kitchen HYDROcodone-acetaminophen (NORCO/VICODIN) 5-325 MG tablet   Oral   Take 1 tablet by mouth every 4 (four) hours as needed for moderate pain.   20 tablet   0   . naproxen (NAPROSYN) 500 MG tablet   Oral   Take 1 tablet (500 mg total) by mouth 2 (two) times daily with a meal.   20 tablet   2     Allergies Review of patient's allergies indicates no known allergies.  Family History  Problem Relation Age of Onset  . Breast cancer Mother 69    bilateral breast cancer; d. 31; no genetic testing  . Prostate cancer Father 64    no genetic testing and is an only child with a mother with cancer  . Diabetes Father   . Breast cancer Sister 58     BRCA1 positive, sister is Ayinde Swim (DOB 06/27/70) tested at [facility] with a report date of 01/13/2015. Result is positive for a pathogenic mutation called BRCA1, p.[id number].  . Cancer Paternal Grandmother 70    unknown type of  rare cancer    Social History Social History  Substance Use Topics  . Smoking status: Never Smoker   . Smokeless tobacco: Never Used  . Alcohol Use: 0.0 oz/week    0 Standard drinks or equivalent per week     Comment: 2-4 per day    Review of Systems Constitutional: No fever/chills Eyes: No visual changes. ENT: No sore throat. Cardiovascular: Denies chest pain. Respiratory: Denies shortness of breath. Gastrointestinal: +abd pain, no N/V, +diarrhea Genitourinary: Negative for dysuria. Musculoskeletal: Negative for back pain. Skin: Negative for rash. Neurological: Negative for headaches, focal weakness or numbness.  10-point ROS otherwise  negative.  ____________________________________________   PHYSICAL EXAM:  VITAL SIGNS: ED Triage Vitals  Enc Vitals Group     BP 03/05/16 1422 139/84 mmHg     Pulse Rate 03/05/16 1422 83     Resp 03/05/16 1422 20     Temp 03/05/16 1422 98 F (36.7 C)     Temp Source 03/05/16 1422 Oral     SpO2 03/05/16 1422 99 %     Weight 03/05/16 1422 178 lb (80.74 kg)     Height 03/05/16 1422 6' (1.829 m)     Head Cir --      Peak Flow --      Pain Score 03/05/16 1424 4     Pain Loc --      Pain Edu? --      Excl. in [place]? --     Constitutional: Alert and oriented. Well appearing and in no acute distress. Eyes: Conjunctivae are normal. PERRL. EOMI. Head: Atraumatic. Nose: No congestion/rhinnorhea. Mouth/Throat: Mucous membranes are moist.  Oropharynx non-erythematous. Neck: No stridor.  No meningeal signs.   Cardiovascular: Normal rate, regular rhythm. Good peripheral circulation. Grossly normal heart sounds.   Respiratory: Normal respiratory effort.  No retractions. Lungs CTAB. Gastrointestinal: Soft With moderate tenderness to palpation throughout the lower abdomen with rebound.  No guarding.  Not peritonitis. Musculoskeletal: No lower extremity tenderness nor edema. No gross deformities of extremities. Neurologic:  Normal speech and language. No gross focal neurologic deficits are appreciated.  Skin:  Skin is warm, dry and intact. No rash noted. Psychiatric: Mood and affect are normal. Speech and behavior are normal.  ____________________________________________   LABS (all labs ordered are listed, but only abnormal results are displayed)  Labs Reviewed  CBC WITH DIFFERENTIAL/PLATELET - Abnormal; Notable for the following:    WBC 18.6 (*)    Neutro Abs 16.0 (*)    Monocytes Absolute 1.3 (*)    All other components within normal limits  COMPREHENSIVE METABOLIC PANEL - Abnormal; Notable for the following:    Chloride 100 (*)    Calcium 8.7 (*)    All other components within  normal limits  MAGNESIUM - Abnormal; Notable for the following:    Magnesium 1.6 (*)    All other components within normal limits  PROTIME-INR  APTT  ETHANOL  URINALYSIS COMPLETEWITH MICROSCOPIC ([facility] ONLY)  TYPE AND SCREEN   ____________________________________________  EKG  ED ECG REPORT I, Detrice Cales, the attending physician, personally viewed and interpreted this ECG.  Date: 03/05/2016 EKG Time: 14:34 Rate: 82 Rhythm: normal sinus rhythm QRS Axis: normal Intervals: normal ST/T Wave abnormalities: normal Conduction Disturbances: none Narrative Interpretation: unremarkable  ____________________________________________  RADIOLOGY   Ct Abdomen Pelvis W Contrast  03/05/2016  CLINICAL DATA:  Bilateral pelvic pain for years, worsening over the last 5 days. Change in bowel habits. No prior relevant surgical history common injury or  history of malignancy. EXAM: CT ABDOMEN AND PELVIS WITH CONTRAST TECHNIQUE: Multidetector CT imaging of the abdomen and pelvis was performed using the standard protocol following bolus administration of intravenous contrast. CONTRAST:  189m ISOVUE-300 IOPAMIDOL (ISOVUE-300) INJECTION 61% COMPARISON:  Radiographs 06/06/2011.  CT 02/22/2011. FINDINGS: Lower chest: Clear lung bases. No significant pleural or pericardial effusion. Hepatobiliary: The liver is normal in density without focal abnormality. No evidence of gallstones, gallbladder wall thickening or biliary dilatation. Pancreas: Unremarkable. No pancreatic ductal dilatation or surrounding inflammatory changes. Spleen: Normal in size without focal abnormality. Adrenals/Urinary Tract: Both adrenal glands appear normal. There are several nonobstructing bilateral renal calculi, measure up to 5 mm in the upper pole of the left kidney. There are least 5 left-sided calculi and 3 right-sided calculi. There is no hydronephrosis, ureteral calculus or perinephric soft tissue stranding. Mild posterior bladder  wall thickening is noted without apparent focal urothelial lesion. Stomach/Bowel: The stomach, small bowel, appendix and proximal colon appear normal. There is moderate descending and sigmoid diverticulosis. There is irregular wall thickening of the sigmoid colon extending over several cm. There is surrounding inflammatory change and ill-defined pericolonic air-fluid collection, measuring up to 2.5 cm transverse on image 59 and up to 3.7 cm cephalocaudad, best seen on sagittal image 73. No free air or other extraluminal fluid collection identified. No evidence of bowel obstruction or contrast extravasation. Vascular/Lymphatic: There are no enlarged abdominal or pelvic lymph nodes. There is a 9 mm presacral mesenteric node on image 53, probably reactive. No significant vascular findings. There is mild retroperitoneal edema. Reproductive: Unremarkable. Other: There is small amount of pelvic and right pericolic ascites. The anterior abdominal wall appears unremarkable. Musculoskeletal: No acute or significant osseous findings. IMPRESSION: 1. Findings are consistent with sigmoid diverticulitis with a contained perforation and resulting pericolonic abscess as described. CT or sigmoidoscopy follow-up recommended after antibiotic therapy to exclude underlying neoplasm. 2. Presacral prominent lymph node, likely reactive. 3. No evidence of bowel obstruction or free air. 4. Bilateral nephrolithiasis. No evidence of ureteral calculus or hydronephrosis. 5. These results will be called to the ordering clinician or representative by the Radiology Department at the imaging location. Electronically Signed   By: WRichardean SaleM.D.   On: 03/05/2016 13:38    ____________________________________________   PROCEDURES  Procedure(s) performed: None  Critical Care performed: No ____________________________________________   INITIAL IMPRESSION / ASSESSMENT AND PLAN / ED COURSE  Pertinent labs & imaging results that were  available during my care of the patient were reviewed by me and considered in my medical decision making (see chart for details).  The patient has a perforated diverticulitis with a pericolonic abscess.  I added on some additional labs that may be needed in case he requires surgery and I ordered Zosyn 3.375 g IV.  I also ordered a normal saline infusion (I do not believe he needs a bolus given his unremarkable chemistry, lack of vomiting, and normal vital signs).  I spoke with Dr. PDahlia Byesat 3:50pm and discussed the case with him by phone.  He will come to the emergency department to evaluate the patient in person and likely admit him for further management.  The patient is up-to-date on the plan as well.   ____________________________________________  FINAL CLINICAL IMPRESSION(S) / ED DIAGNOSES  Final diagnoses:  Diverticulitis of colon with perforation  Pericolonic abscess due to diverticulitis     MEDICATIONS GIVEN DURING THIS VISIT:  Medications  piperacillin-tazobactam (ZOSYN) IVPB 3.375 g (3.375 g Intravenous New Bag/Given 03/05/16 1606)  0.9 %  sodium chloride infusion ( Intravenous New Bag/Given 03/05/16 1606)     NEW OUTPATIENT MEDICATIONS STARTED DURING THIS VISIT:  New Prescriptions   No medications on file      Note:  This document was prepared using Dragon voice recognition software and may include unintentional dictation errors.   Hinda Kehr, MD 03/05/16 [phone]

## 2016-03-05 NOTE — ED Notes (Signed)
Iv meds infusing.  Labs sent.  Pt alert.

## 2016-03-05 NOTE — Patient Instructions (Signed)

## 2016-03-06 LAB — CBC
HCT: 37.6 % — ABNORMAL LOW (ref 40.0–52.0)
Hemoglobin: 12.9 g/dL — ABNORMAL LOW (ref 13.0–18.0)
MCH: 30.8 pg (ref 26.0–34.0)
MCHC: 34.4 g/dL (ref 32.0–36.0)
MCV: 89.4 fL (ref 80.0–100.0)
PLATELETS: 169 10*3/uL (ref 150–440)
RBC: 4.2 MIL/uL — AB (ref 4.40–5.90)
RDW: 12.8 % (ref 11.5–14.5)
WBC: 11.1 10*3/uL — ABNORMAL HIGH (ref 3.8–10.6)

## 2016-03-06 LAB — BASIC METABOLIC PANEL
Anion gap: 6 (ref 5–15)
BUN: 9 mg/dL (ref 6–20)
CHLORIDE: 104 mmol/L (ref 101–111)
CO2: 29 mmol/L (ref 22–32)
CREATININE: 1.04 mg/dL (ref 0.61–1.24)
Calcium: 8.5 mg/dL — ABNORMAL LOW (ref 8.9–10.3)
GFR calc non Af Amer: >60 mL/min (ref >60)
GLUCOSE: 104 mg/dL — AB (ref 65–99)
Potassium: 4 mmol/L (ref 3.5–5.1)
SODIUM: 139 mmol/L (ref 135–145)

## 2016-03-06 NOTE — Progress Notes (Signed)
CC: complicated diverticulitis w absces Subjective: Feeling better, minimal pain, hungry. WBC trending down  Objective: Vital signs in last 24 hours: Temp:  [97.9 F (36.6 C)-98.7 F (37.1 C)] 97.9 F (36.6 C) (05/09 1312) Pulse Rate:  [61-93] 65 (05/09 1312) Resp:  [16-20] 16 (05/09 1312) BP: (121-163)/(73-96) 132/82 mmHg (05/09 1312) SpO2:  [99 %-100 %] 100 % (05/09 1312) Last BM Date: 03/06/16  Intake/Output from previous day: 05/08 0701 - 05/09 0700 In: 1723.6 [I.V.:1682.2; IV Piggyback:41.3] Out: 500 [Urine:500] Intake/Output this shift: Total I/O In: 238 [I.V.:188; IV Piggyback:50] Out: -   Physical exam: NAD in good spirits Abd: soft, Minimal suprapubic tenderness, no peritonitis Ext: well perfused, no edema  Lab Results: CBC   Recent Labs  03/05/16 1426 03/06/16 0433  WBC 18.6* 11.1*  HGB 13.9 12.9*  HCT 40.6 37.6*  PLT 210 169   BMET  Recent Labs  03/05/16 1426 03/06/16 0433  NA 135 139  K 4.0 4.0  CL 100* 104  CO2 24 29  GLUCOSE 94 104*  BUN 10 9  CREATININE 0.86 1.04  CALCIUM 8.7* 8.5*   PT/INR  Recent Labs  03/05/16 1428  LABPROT 13.1  INR 0.97   ABG No results for input(s): PHART, HCO3 in the last 72 hours.  Invalid input(s): PCO2, PO2  Studies/Results: Ct Abdomen Pelvis W Contrast  03/05/2016  CLINICAL DATA:  Bilateral pelvic pain for years, worsening over the last 5 days. Change in bowel habits. No prior relevant surgical history common injury or history of malignancy. EXAM: CT ABDOMEN AND PELVIS WITH CONTRAST TECHNIQUE: Multidetector CT imaging of the abdomen and pelvis was performed using the standard protocol following bolus administration of intravenous contrast. CONTRAST:  168mL ISOVUE-300 IOPAMIDOL (ISOVUE-300) INJECTION 61% COMPARISON:  Radiographs 06/06/2011.  CT 02/22/2011. FINDINGS: Lower chest: Clear lung bases. No significant pleural or pericardial effusion. Hepatobiliary: The liver is normal in density without focal  abnormality. No evidence of gallstones, gallbladder wall thickening or biliary dilatation. Pancreas: Unremarkable. No pancreatic ductal dilatation or surrounding inflammatory changes. Spleen: Normal in size without focal abnormality. Adrenals/Urinary Tract: Both adrenal glands appear normal. There are several nonobstructing bilateral renal calculi, measure up to 5 mm in the upper pole of the left kidney. There are least 5 left-sided calculi and 3 right-sided calculi. There is no hydronephrosis, ureteral calculus or perinephric soft tissue stranding. Mild posterior bladder wall thickening is noted without apparent focal urothelial lesion. Stomach/Bowel: The stomach, small bowel, appendix and proximal colon appear normal. There is moderate descending and sigmoid diverticulosis. There is irregular wall thickening of the sigmoid colon extending over several cm. There is surrounding inflammatory change and ill-defined pericolonic air-fluid collection, measuring up to 2.5 cm transverse on image 59 and up to 3.7 cm cephalocaudad, best seen on sagittal image 73. No free air or other extraluminal fluid collection identified. No evidence of bowel obstruction or contrast extravasation. Vascular/Lymphatic: There are no enlarged abdominal or pelvic lymph nodes. There is a 9 mm presacral mesenteric node on image 53, probably reactive. No significant vascular findings. There is mild retroperitoneal edema. Reproductive: Unremarkable. Other: There is small amount of pelvic and right pericolic ascites. The anterior abdominal wall appears unremarkable. Musculoskeletal: No acute or significant osseous findings. IMPRESSION: 1. Findings are consistent with sigmoid diverticulitis with a contained perforation and resulting pericolonic abscess as described. CT or sigmoidoscopy follow-up recommended after antibiotic therapy to exclude underlying neoplasm. 2. Presacral prominent lymph node, likely reactive. 3. No evidence of bowel obstruction  or free  air. 4. Bilateral nephrolithiasis. No evidence of ureteral calculus or hydronephrosis. 5. These results will be called to the ordering clinician or representative by the Radiology Department at the imaging location. Electronically Signed   By: Richardean Sale M.D.   On: 03/05/2016 13:38    Anti-infectives: Anti-infectives    Start     Dose/Rate Route Frequency Ordered Stop   03/05/16 2200  piperacillin-tazobactam (ZOSYN) IVPB 3.375 g     3.375 g 12.5 mL/hr over 240 Minutes Intravenous Every 8 hours 03/05/16 1855     03/05/16 1545  piperacillin-tazobactam (ZOSYN) IVPB 3.375 g     3.375 g 100 mL/hr over 30 Minutes Intravenous  Once 03/05/16 1538 03/05/16 1707      Assessment/Plan: Complicated diverticulitis with abscess clinically improving. Continue IV antibiotics and start clear liquid diet. No immediate surgical intervention  Caroleen Hamman, MD, [facility]  03/06/2016

## 2016-03-06 NOTE — Plan of Care (Signed)
Problem: Nutrition: Goal: Adequate nutrition will be maintained Outcome: Progressing Diet upgraded

## 2016-03-07 LAB — CBC
HCT: 35.6 % — ABNORMAL LOW (ref 40.0–52.0)
Hemoglobin: 12.4 g/dL — ABNORMAL LOW (ref 13.0–18.0)
MCH: 31.2 pg (ref 26.0–34.0)
MCHC: 34.7 g/dL (ref 32.0–36.0)
MCV: 89.9 fL (ref 80.0–100.0)
PLATELETS: 163 10*3/uL (ref 150–440)
RBC: 3.96 MIL/uL — ABNORMAL LOW (ref 4.40–5.90)
RDW: 12.7 % (ref 11.5–14.5)
WBC: 6.3 10*3/uL (ref 3.8–10.6)

## 2016-03-07 NOTE — Progress Notes (Signed)
ANTIBIOTIC CONSULT NOTE - INITIAL  Pharmacy Consult for Zosyn  Indication: complicated diverticulitis with abscess  No Known Allergies  Patient Measurements: Height: 6' (182.9 cm) Weight: 178 lb (80.74 kg) IBW/kg (Calculated) : 77.6 Adjusted Body Weight:   Vital Signs: Temp: 97.8 F (36.6 C) (05/10 0440) Temp Source: Oral (05/10 0440) BP: 154/96 mmHg (05/10 0440) Pulse Rate: 54 (05/10 0440) Intake/Output from previous day: 05/09 0701 - 05/10 0700 In: 2571.8 [P.O.:222; I.V.:2216.8; IV Piggyback:133] Out: 425 [Urine:425] Intake/Output from this shift: Total I/O In: 1390 [P.O.:1000; IV Piggyback:390] Out: -   Labs:  Recent Labs  03/05/16 1426 03/06/16 0433 03/07/16 0409  WBC 18.6* 11.1* 6.3  HGB 13.9 12.9* 12.4*  PLT 210 169 163  CREATININE 0.86 1.04  --    Estimated Creatinine Clearance: 99.5 mL/min (by C-G formula based on Cr of 1.04). No results for input(s): VANCOTROUGH, VANCOPEAK, VANCORANDOM, GENTTROUGH, GENTPEAK, GENTRANDOM, TOBRATROUGH, TOBRAPEAK, TOBRARND, AMIKACINPEAK, AMIKACINTROU, AMIKACIN in the last 72 hours.   Microbiology: No results found for this or any previous visit (from the past 720 hour(s)).  Medical History: Past Medical History  Diagnosis Date  . Kidney stones   . DVT (deep venous thrombosis) (HCC)     right leg  . Post-operative nausea and vomiting   . BRCA1 positive     Medications:  Scheduled:  . enoxaparin (LOVENOX) injection  40 mg Subcutaneous Q24H  . ketorolac  15 mg Intravenous Q6H  . pantoprazole (PROTONIX) IV  40 mg Intravenous QHS  . piperacillin-tazobactam (ZOSYN)  IV  3.375 g Intravenous Q8H   Assessment: CrCl = 120.3 ml/min   Goal of Therapy:  resolution of infection  Plan:   Continue zosyn 3.375g q 8 hr EI dosing. Pt is improving clinically.  Melissa D Maccia 03/07/2016,11:34 AM

## 2016-03-07 NOTE — Progress Notes (Signed)
CC: Diverticular abscess Subjective: Doing well, mild intermittent pain, wbc normal AVSS  Objective: Vital signs in last 24 hours: Temp:  [97.7 F (36.5 C)-98.2 F (36.8 C)] 97.7 F (36.5 C) (05/10 1249) Pulse Rate:  [54-66] 66 (05/10 1249) Resp:  [16-20] 16 (05/10 1249) BP: (142-154)/(73-96) 146/73 mmHg (05/10 1249) SpO2:  [100 %] 100 % (05/10 1249) Last BM Date: 03/06/16  Intake/Output from previous day: 05/09 0701 - 05/10 0700 In: 2571.8 [P.O.:222; I.V.:2216.8; IV Piggyback:133] Out: 425 [Urine:425] Intake/Output this shift: Total I/O In: 1390 [P.O.:1000; IV Piggyback:390] Out: -   Physical exam: NAD  Abd: soft, mild TTP suprapubic area, no peritonitis Lab Results: CBC   Recent Labs  03/06/16 0433 03/07/16 0409  WBC 11.1* 6.3  HGB 12.9* 12.4*  HCT 37.6* 35.6*  PLT 169 163   BMET  Recent Labs  03/05/16 1426 03/06/16 0433  NA 135 139  K 4.0 4.0  CL 100* 104  CO2 24 29  GLUCOSE 94 104*  BUN 10 9  CREATININE 0.86 1.04  CALCIUM 8.7* 8.5*   PT/INR  Recent Labs  03/05/16 1428  LABPROT 13.1  INR 0.97   ABG No results for input(s): PHART, HCO3 in the last 72 hours.  Invalid input(s): PCO2, PO2  Studies/Results: No results found.  Anti-infectives: Anti-infectives    Start     Dose/Rate Route Frequency Ordered Stop   03/05/16 2200  piperacillin-tazobactam (ZOSYN) IVPB 3.375 g     3.375 g 12.5 mL/hr over 240 Minutes Intravenous Every 8 hours 03/05/16 1855     03/05/16 1545  piperacillin-tazobactam (ZOSYN) IVPB 3.375 g     3.375 g 100 mL/hr over 30 Minutes Intravenous  Once 03/05/16 1538 03/05/16 1707      Assessment/Plan: Diverticular abscess responding to antibiotic therapy Advance diet and likely DC home tomorrow No Immediate need for surgical intervention  Caroleen Hamman, MD, FACS  03/07/2016

## 2016-03-08 MED ORDER — CIPROFLOXACIN HCL 500 MG PO TABS
500.0000 mg | ORAL_TABLET | Freq: Two times a day (BID) | ORAL | Status: DC
  Administered 2016-03-08: 500 mg via ORAL
  Filled 2016-03-08: qty 1

## 2016-03-08 MED ORDER — METRONIDAZOLE 500 MG PO TABS
500.0000 mg | ORAL_TABLET | Freq: Three times a day (TID) | ORAL | Status: DC
  Administered 2016-03-08: 500 mg via ORAL
  Filled 2016-03-08: qty 1

## 2016-03-08 MED ORDER — METRONIDAZOLE 500 MG PO TABS: 500.0000 mg | ORAL_TABLET | Freq: Three times a day (TID) | ORAL | Status: DC

## 2016-03-08 MED ORDER — CIPROFLOXACIN HCL 500 MG PO TABS: 500.0000 mg | ORAL_TABLET | Freq: Two times a day (BID) | ORAL | Status: DC

## 2016-03-08 MED ORDER — OXYCODONE-ACETAMINOPHEN 7.5-325 MG PO TABS: 1.0000 | ORAL_TABLET | ORAL | Status: DC | PRN

## 2016-03-08 NOTE — Progress Notes (Signed)
Patient stated he did have a fall a about a month ago with his concussion, with that it puts him at a moderate + fall risk.  He was educated about the alarm, but he refused.

## 2016-03-08 NOTE — Progress Notes (Signed)
MD ordered patient to be discharged home.  Discharge instructions were reviewed with the patient and he voiced understanding.  Prescriptions were given to the patient.  Follow-up appointment was made.  IV was removed with catheter intact.  All patients questions were answered.  Patient waiting on his wife to get here to leave.

## 2016-03-08 NOTE — Discharge Summary (Signed)
Patient ID: Connor Stone MRN: [id number] DOB/AGE: November 27, 1970 45 y.o.  Admit date: 03/05/2016 Discharge date: 03/08/2016   Discharge Diagnoses:  Active Problems:   Diverticulitis large intestine   Procedures:None  Hospital Course: This 45 year old very nice gentleman seen in the emergency room for suprapubic pain and nausea. Workup revealed evidence of diverticulitis with a diverticular abscess measuring up to 3 cm. The abscess was deep and there was no immediate window for percutaneous drainage. An patient was admitted and for IV antibiotics and nothing by mouth. His clinical condition improved and his diet was advanced slowly to a clear liquid diet and then a low residue diet. At the time of discharge she was ambulating, his white count was normal, vital signs were stable. His abdomen was soft nontender, no peritonitis. He was ambulating. Discussed with the patient about Following up and that medication as well as elective sigmoid colectomy once his acute episode resolves. He'll complete a week of by mouth antibiotics. Condition of the patient at time of this are stable   Disposition: 01-Home or Self Care  Discharge Instructions    Call MD for:  difficulty breathing, headache or visual disturbances    Complete by:  As directed      Call MD for:  hives    Complete by:  As directed      Call MD for:  persistant dizziness or light-headedness    Complete by:  As directed      Call MD for:  persistant nausea and vomiting    Complete by:  As directed      Call MD for:  redness, tenderness, or signs of infection (pain, swelling, redness, odor or green/yellow discharge around incision site)    Complete by:  As directed      Call MD for:  severe uncontrolled pain    Complete by:  As directed      Call MD for:  temperature >100.4    Complete by:  As directed      Diet - low sodium heart healthy    Complete by:  As directed      Discharge instructions    Complete by:  As directed   Please  provide pt w work excuse. May return to work next Monday 5/15     Increase activity slowly    Complete by:  As directed      No wound care    Complete by:  As directed             Medication List    TAKE these medications        ciprofloxacin 500 MG tablet  Commonly known as:  CIPRO  Take 1 tablet (500 mg total) by mouth 2 (two) times daily.     metroNIDAZOLE 500 MG tablet  Commonly known as:  FLAGYL  Take 1 tablet (500 mg total) by mouth 3 (three) times daily.     oxyCODONE-acetaminophen 7.5-325 MG tablet  Commonly known as:  PERCOCET  Take 1 tablet by mouth every 4 (four) hours as needed for severe pain.      ASK your doctor about these medications        dicyclomine 20 MG tablet  Commonly known as:  BENTYL  Take 20 mg by mouth 2 (two) times daily as needed (for cramping).     HYDROcodone-acetaminophen 5-325 MG tablet  Commonly known as:  NORCO/VICODIN  Take 1 tablet by mouth every 4 (four) hours as needed for moderate pain.  ibuprofen 200 MG tablet  Commonly known as:  ADVIL,MOTRIN  Take 400-600 mg by mouth every 6 (six) hours as needed for headache or mild pain.     naproxen 500 MG tablet  Commonly known as:  NAPROSYN  Take 500 mg by mouth 2 (two) times daily as needed for mild pain.           Follow-up Information    Follow up with Jules Husbands, MD. Schedule an appointment as soon as possible for a visit in 1 week.   Specialty:  General Surgery   Why:  F/U Dr. Dahlia Byes needs to see him next available appt w him   Contact information:   [address] [place] [address] [phone]        Caroleen Hamman, MD FACS

## 2016-03-15 ENCOUNTER — Ambulatory Visit (INDEPENDENT_AMBULATORY_CARE_PROVIDER_SITE_OTHER): Payer: BC Managed Care – PPO | Admitting: Surgery

## 2016-03-15 ENCOUNTER — Encounter: Payer: Self-pay | Admitting: Surgery

## 2016-03-15 VITALS — BP 134/80 | HR 78 | Temp 98.7°F | Ht 72.0 in | Wt 177.8 lb

## 2016-03-15 DIAGNOSIS — K5732 Diverticulitis of large intestine without perforation or abscess without bleeding: Secondary | ICD-10-CM

## 2016-03-15 NOTE — Patient Instructions (Addendum)
You will need to see Dr. Allen Norris, the Gastroenterologist and we will also follow-up with you in approximately 6 weeks. See appointments below.   Diverticulitis Diverticulitis is inflammation or infection of small pouches in your colon that form when you have a condition called diverticulosis. The pouches in your colon are called diverticula. Your colon, or large intestine, is where water is absorbed and stool is formed. Complications of diverticulitis can include:  Bleeding.  Severe infection.  Severe pain.  Perforation of your colon.  Obstruction of your colon. CAUSES  Diverticulitis is caused by bacteria. Diverticulitis happens when stool becomes trapped in diverticula. This allows bacteria to grow in the diverticula, which can lead to inflammation and infection. RISK FACTORS People with diverticulosis are at risk for diverticulitis. Eating a diet that does not include enough fiber from fruits and vegetables may make diverticulitis more likely to develop. SYMPTOMS  Symptoms of diverticulitis may include:  Abdominal pain and tenderness. The pain is normally located on the left side of the abdomen, but may occur in other areas.  Fever and chills.  Bloating.  Cramping.  Nausea.  Vomiting.  Constipation.  Diarrhea.  Blood in your stool. DIAGNOSIS  Your health care provider will ask you about your medical history and do a physical exam. You may need to have tests done because many medical conditions can cause the same symptoms as diverticulitis. Tests may include:  Blood tests.  Urine tests.  Imaging tests of the abdomen, including X-rays and CT scans. When your condition is under control, your health care provider may recommend that you have a colonoscopy. A colonoscopy can show how severe your diverticula are and whether something else is causing your symptoms. TREATMENT  Most cases of diverticulitis are mild and can be treated at home. Treatment may include:  Taking  over-the-counter pain medicines.  Following a clear liquid diet.  Taking antibiotic medicines by mouth for 7-10 days. More severe cases may be treated at a hospital. Treatment may include:  Not eating or drinking.  Taking prescription pain medicine.  Receiving antibiotic medicines through an IV tube.  Receiving fluids and nutrition through an IV tube.  Surgery. HOME CARE INSTRUCTIONS   Follow your health care provider's instructions carefully.  Follow a full liquid diet or other diet as directed by your health care provider. After your symptoms improve, your health care provider may tell you to change your diet. He or she may recommend you eat a high-fiber diet. Fruits and vegetables are good sources of fiber. Fiber makes it easier to pass stool.  Take fiber supplements or probiotics as directed by your health care provider.  Only take medicines as directed by your health care provider.  Keep all your follow-up appointments. SEEK MEDICAL CARE IF:   Your pain does not improve.  You have a hard time eating food.  Your bowel movements do not return to normal. SEEK IMMEDIATE MEDICAL CARE IF:   Your pain becomes worse.  Your symptoms do not get better.  Your symptoms suddenly get worse.  You have a fever.  You have repeated vomiting.  You have bloody or black, tarry stools. MAKE SURE YOU:   Understand these instructions.  Will watch your condition.  Will get help right away if you are not doing well or get worse.   This information is not intended to replace advice given to you by your health care provider. Make sure you discuss any questions you have with your health care provider.  Document Released: 07/25/2005 Document Revised: 10/20/2013 Document Reviewed: 09/09/2013 Elsevier Interactive Patient Education 2016 Elsevier Inc.   High-Fiber Diet Fiber, also called dietary fiber, is a type of carbohydrate found in fruits, vegetables, whole grains, and beans.  A high-fiber diet can have many health benefits. Your health care provider may recommend a high-fiber diet to help:  Prevent constipation. Fiber can make your bowel movements more regular.  Lower your cholesterol.  Relieve hemorrhoids, uncomplicated diverticulosis, or irritable bowel syndrome.  Prevent overeating as part of a weight-loss plan.  Prevent heart disease, type 2 diabetes, and certain cancers. WHAT IS MY PLAN? The recommended daily intake of fiber includes:  38 grams for men under age 63.  53 grams for men over age 73.  48 grams for women under age 40.  21 grams for women over age 25. You can get the recommended daily intake of dietary fiber by eating a variety of fruits, vegetables, grains, and beans. Your health care provider may also recommend a fiber supplement if it is not possible to get enough fiber through your diet. WHAT DO I NEED TO KNOW ABOUT A HIGH-FIBER DIET?  Fiber supplements have not been widely studied for their effectiveness, so it is better to get fiber through food sources.  Always check the fiber content on thenutrition facts label of any prepackaged food. Look for foods that contain at least 5 grams of fiber per serving.  Ask your dietitian if you have questions about specific foods that are related to your condition, especially if those foods are not listed in the following section.  Increase your daily fiber consumption gradually. Increasing your intake of dietary fiber too quickly may cause bloating, cramping, or gas.  Drink plenty of water. Water helps you to digest fiber. WHAT FOODS CAN I EAT? Grains Whole-grain breads. Multigrain cereal. Oats and oatmeal. Brown rice. Barley. Bulgur wheat. [place]p [place]. Bran muffins. Popcorn. Rye wafer crackers. Vegetables Sweet potatoes. Spinach. Kale. Artichokes. Cabbage. Broccoli. Green peas. Carrots. Squash. Fruits Berries. Pears. Apples. Oranges. Avocados. Prunes and raisins. Dried figs. Meats and Other  Protein Sources Navy, kidney, pinto, and soy beans. Split peas. Lentils. Nuts and seeds. Dairy Fiber-fortified yogurt. Beverages Fiber-fortified soy milk. Fiber-fortified orange juice. Other Fiber bars. The items listed above may not be a complete list of recommended foods or beverages. Contact your dietitian for more options. WHAT FOODS ARE NOT RECOMMENDED? Grains White bread. Pasta made with refined flour. White rice. Vegetables Fried potatoes. Canned vegetables. Well-cooked vegetables.  Fruits Fruit juice. Cooked, strained fruit. Meats and Other Protein Sources Fatty cuts of meat. Fried Sales executive or fried fish. Dairy Milk. Yogurt. Cream cheese. Sour cream. Beverages Soft drinks. Other Cakes and pastries. Butter and oils. The items listed above may not be a complete list of foods and beverages to avoid. Contact your dietitian for more information. WHAT ARE SOME TIPS FOR INCLUDING HIGH-FIBER FOODS IN MY DIET?  Eat a wide variety of high-fiber foods.  Make sure that half of all grains consumed each day are whole grains.  Replace breads and cereals made from refined flour or white flour with whole-grain breads and cereals.  Replace white rice with brown rice, bulgur wheat, or millet.  Start the day with a breakfast that is high in fiber, such as a cereal that contains at least 5 grams of fiber per serving.  Use beans in place of meat in soups, salads, or pasta.  Eat high-fiber snacks, such as berries, raw vegetables, nuts, or popcorn.   This  information is not intended to replace advice given to you by your health care provider. Make sure you discuss any questions you have with your health care provider.   Document Released: 10/15/2005 Document Revised: 11/05/2014 Document Reviewed: 03/30/2014 Elsevier Interactive Patient Education [employer].

## 2016-03-16 NOTE — Progress Notes (Signed)
Mr. Padula is a 45 year old male with a history of BRCA1 recently hospitalized for complicated diverticular abscess that did well with antibiotic therapy. 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 He is now symptom free denies any abdominal pain and is taking diet well. Normal bowel movements. 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 The Question is whether he will need a repeat colonoscopy. Discussed with patient in detail and I ask him to see Dr.Wohl and see if he considers that a repeat colonoscopies weren't taken before an active sigmoid colectomy. The reasoning that I'm asking this is because of his genetic predisposition and he is large tubulovillous adenoma. Surgical perspective he wishes to think about the options and we'll see him back and in about 6 weeks and reassess him and counsel him again about the risks benefits and possible complications of an elective laparoscopic sigmoid colectomy. Extensive counseling provided. Spent at least 25 minutes in this encounter with the majority of time I located to the face-to-face counseling

## 2016-03-28 ENCOUNTER — Encounter: Payer: Self-pay | Admitting: Gastroenterology

## 2016-03-28 ENCOUNTER — Ambulatory Visit (INDEPENDENT_AMBULATORY_CARE_PROVIDER_SITE_OTHER): Payer: BC Managed Care – PPO | Admitting: Gastroenterology

## 2016-03-28 VITALS — BP 132/77 | HR 62 | Temp 97.8°F | Ht 72.0 in | Wt 178.0 lb

## 2016-03-28 DIAGNOSIS — K5732 Diverticulitis of large intestine without perforation or abscess without bleeding: Secondary | ICD-10-CM

## 2016-03-28 NOTE — Progress Notes (Signed)
Gastroenterology Consultation  Referring Provider:     Lucille Passy, MD Primary Care Physician:  Arnette Norris, MD Primary Gastroenterologist:  Dr. Allen Norris     Reason for Consultation:     History of colon polyps with diverticulitis        HPI:   Connor Stone is a 45 y.o. y/o male referred for consultation & management of History of colon polyps with diverticulitis by Dr. Arnette Norris, MD.  This patient comes in today with a colonoscopy approximate 7 months ago that was reported to have a 2.5 cm polyp that was removed. The patient was found to have a tubulovillous adenoma. The patient was then told that he needs a repeat colonoscopy in 3 years. The pathologist reported the size of the polyp to be a maximum diameter of 0.9 cm. The patient reports that he has intermittent abdominal pains that he thinks may have been diverticulitis in the past. The patient also had diverticulosis throughout the entire colon on his last colonoscopy. He was seen by surgery because of the diverticulitis with a abscess recently. He was then sent to me for evaluation for possible repeat colonoscopy due to the large size of the tubulovillous polyp removed. He is not having any complaints at the present time. He is considering whether or not to undergo surgery to remove his sigmoid colon. He is concerned because the rest of the colon also had diverticulosis.  Past Medical History  Diagnosis Date  . Kidney stones   . DVT (deep venous thrombosis) (HCC)     right leg  . Post-operative nausea and vomiting   . BRCA1 positive     Past Surgical History  Procedure Laterality Date  . Knee arthroscopy w/ acl reconstruction Right     Prior to Admission medications   Medication Sig Start Date End Date Taking? Authorizing Provider  ibuprofen (ADVIL,MOTRIN) 200 MG tablet Take 400-600 mg by mouth every 6 (six) hours as needed for headache or mild pain.   Yes Historical Provider, MD  dicyclomine (BENTYL) 20 MG tablet Take 20 mg by  mouth 2 (two) times daily as needed (for cramping). Reported on 03/28/2016    Historical Provider, MD    Family History  Problem Relation Age of Onset  . Breast cancer Mother 46    bilateral breast cancer; d. 11; no genetic testing  . Prostate cancer Father 43    no genetic testing and is an only child with a mother with cancer  . Diabetes Father   . Breast cancer Sister 46     BRCA1 positive, sister is Lindley Hiney (DOB 06/27/70) tested at [facility] with a report date of 01/13/2015. Result is positive for a pathogenic mutation called BRCA1, p.C61G.  . Cancer Paternal Grandmother 57    unknown type of rare cancer     Social History  Substance Use Topics  . Smoking status: Never Smoker   . Smokeless tobacco: Never Used  . Alcohol Use: 0.0 oz/week    0 Standard drinks or equivalent per week     Comment: 2-4 beers / daily    Allergies as of 03/28/2016  . (No Known Allergies)    Review of Systems:    All systems reviewed and negative except where noted in HPI.   Physical Exam:  BP 132/77 mmHg  Pulse 62  Temp(Src) 97.8 F (36.6 C) (Oral)  Ht 6' (1.829 m)  Wt 178 lb (80.74 kg)  BMI 24.14 kg/m2 No LMP for male  patient. Psych:  Alert and cooperative. Normal mood and affect. General:   Alert,  Well-developed, well-nourished, pleasant and cooperative in NAD Head:  Normocephalic and atraumatic. Eyes:  Sclera clear, no icterus.   Conjunctiva pink. Ears:  Normal auditory acuity. Nose:  No deformity, discharge, or lesions. Mouth:  No deformity or lesions,oropharynx pink & moist. Neck:  Supple; no masses or thyromegaly. Lungs:  Respirations even and unlabored.  Clear throughout to auscultation.   No wheezes, crackles, or rhonchi. No acute distress. Heart:  Regular rate and rhythm; no murmurs, clicks, rubs, or gallops. Abdomen:  Normal bowel sounds.  No bruits.  Soft, non-tender and non-distended without masses, hepatosplenomegaly or hernias noted.  No guarding or rebound tenderness.   Negative Carnett sign.   Rectal:  Deferred.  Msk:  Symmetrical without gross deformities.  Good, equal movement & strength bilaterally. Pulses:  Normal pulses noted. Extremities:  No clubbing or edema.  No cyanosis. Neurologic:  Alert and oriented x3;  grossly normal neurologically. Skin:  Intact without significant lesions or rashes.  No jaundice. Lymph Nodes:  No significant cervical adenopathy. Psych:  Alert and cooperative. Normal mood and affect.  Imaging Studies: Ct Abdomen Pelvis W Contrast  03/05/2016  CLINICAL DATA:  Bilateral pelvic pain for years, worsening over the last 5 days. Change in bowel habits. No prior relevant surgical history common injury or history of malignancy. EXAM: CT ABDOMEN AND PELVIS WITH CONTRAST TECHNIQUE: Multidetector CT imaging of the abdomen and pelvis was performed using the standard protocol following bolus administration of intravenous contrast. CONTRAST:  172m ISOVUE-300 IOPAMIDOL (ISOVUE-300) INJECTION 61% COMPARISON:  Radiographs 06/06/2011.  CT 02/22/2011. FINDINGS: Lower chest: Clear lung bases. No significant pleural or pericardial effusion. Hepatobiliary: The liver is normal in density without focal abnormality. No evidence of gallstones, gallbladder wall thickening or biliary dilatation. Pancreas: Unremarkable. No pancreatic ductal dilatation or surrounding inflammatory changes. Spleen: Normal in size without focal abnormality. Adrenals/Urinary Tract: Both adrenal glands appear normal. There are several nonobstructing bilateral renal calculi, measure up to 5 mm in the upper pole of the left kidney. There are least 5 left-sided calculi and 3 right-sided calculi. There is no hydronephrosis, ureteral calculus or perinephric soft tissue stranding. Mild posterior bladder wall thickening is noted without apparent focal urothelial lesion. Stomach/Bowel: The stomach, small bowel, appendix and proximal colon appear normal. There is moderate descending and sigmoid  diverticulosis. There is irregular wall thickening of the sigmoid colon extending over several cm. There is surrounding inflammatory change and ill-defined pericolonic air-fluid collection, measuring up to 2.5 cm transverse on image 59 and up to 3.7 cm cephalocaudad, best seen on sagittal image 73. No free air or other extraluminal fluid collection identified. No evidence of bowel obstruction or contrast extravasation. Vascular/Lymphatic: There are no enlarged abdominal or pelvic lymph nodes. There is a 9 mm presacral mesenteric node on image 53, probably reactive. No significant vascular findings. There is mild retroperitoneal edema. Reproductive: Unremarkable. Other: There is small amount of pelvic and right pericolic ascites. The anterior abdominal wall appears unremarkable. Musculoskeletal: No acute or significant osseous findings. IMPRESSION: 1. Findings are consistent with sigmoid diverticulitis with a contained perforation and resulting pericolonic abscess as described. CT or sigmoidoscopy follow-up recommended after antibiotic therapy to exclude underlying neoplasm. 2. Presacral prominent lymph node, likely reactive. 3. No evidence of bowel obstruction or free air. 4. Bilateral nephrolithiasis. No evidence of ureteral calculus or hydronephrosis. 5. These results will be called to the ordering clinician or representative by the Radiology Department  at the imaging location. Electronically Signed   By: Richardean Sale M.D.   On: 03/05/2016 13:38    Assessment and Plan:   CORDARRYL MONRREAL is a 45 y.o. y/o male who comes in today with a history of a tubulovillous adenoma removed from his colon by another gastrologist that was reported to be 9 mm in largest diameter by pathology. The patient was told that he needed another colonoscopy in 3 years. He now comes in after having an attack of diverticulitis with a small abscess found at that time. He is being evaluated by me because surgery is concerned whether he  needs another colonoscopy prior to having any surgery. The patient has been told that due to the size of the original polyp if it was over 10 mm he should have a repeat colonoscopy in one year and not 3 years in addition to the polyp having a villous component. The patient does not need a repeat colonoscopy prior to having a surgery. Patient has been explained the plan and agrees with it. He will follow up with surgery when and if he decides to undergo resection of the affected colon.   Note: This dictation was prepared with Dragon dictation along with smaller phrase technology. Any transcriptional errors that result from this process are unintentional.

## 2016-04-16 ENCOUNTER — Encounter: Payer: Self-pay | Admitting: Gastroenterology

## 2016-04-30 ENCOUNTER — Ambulatory Visit: Payer: Self-pay | Admitting: Surgery

## 2016-05-02 ENCOUNTER — Encounter: Payer: Self-pay | Admitting: Surgery

## 2016-05-02 ENCOUNTER — Ambulatory Visit (INDEPENDENT_AMBULATORY_CARE_PROVIDER_SITE_OTHER): Payer: BC Managed Care – PPO | Admitting: Surgery

## 2016-05-02 VITALS — BP 138/75 | HR 79 | Temp 98.9°F | Ht 73.0 in | Wt 180.0 lb

## 2016-05-02 DIAGNOSIS — K5720 Diverticulitis of large intestine with perforation and abscess without bleeding: Secondary | ICD-10-CM | POA: Diagnosis not present

## 2016-05-02 NOTE — Patient Instructions (Signed)
We will call you and schedule an appointment for mid October. Please call our office if you have any questions or concerns.

## 2016-05-02 NOTE — Progress Notes (Signed)
Connor Stone is following up for complicated diverticulitis and abscess treated medically. He did have a tubulovillous adenoma and which was resected endoscopically. He also had a recent consultation with Dr. Allen Norris regarding further colonoscopy before and elective sigmoid colectomy. This time we'll hold off for any colonoscopy. At the has not been any more recent episodes of diverticulitis at this time and he feels well. He has been tolerating diet with regular bowel movements.   PE NAD Abd: soft NT  A/P N diverticulitis. Discussed with him in detail about the risk benefits and potential complications of an elective sigmoid colectomy. He wishes to talk to his wife and come back in October with his wife and hopefully do an elective sigmoid colectomy before the end of the year. For now he'll maintain a high-fiber diet and even if there is more episodes of diverticulitis who knows to call us back.

## 2016-05-18 ENCOUNTER — Telehealth: Payer: Self-pay

## 2016-05-18 NOTE — Telephone Encounter (Signed)
LVM for patient to call office so we can schedule his F/U appointment in October with Dr.Pabon.

## 2016-05-21 ENCOUNTER — Telehealth: Payer: Self-pay | Admitting: Surgery

## 2016-05-21 NOTE — Telephone Encounter (Signed)
Appointment has been made with Dr Dahlia Byes on 08/01/16 in [place]. Patient was mailed an appointment reminder.

## 2016-05-21 NOTE — Telephone Encounter (Signed)
Patient has called and stated that he is a vibration type feeling in his abdomen that he wants to discuss. Patient has made a follow up appointment with Dr Dahlia Byes on 08/01/16 to discuss elective sigmoid colectomy.   Phone number has been verified. Please call patient to discuss his symptoms.

## 2016-05-23 NOTE — Telephone Encounter (Signed)
Returned phone call to patient. No answer. Left voicemail for return phone call. 

## 2016-05-23 NOTE — Telephone Encounter (Signed)
Please see previous note.

## 2016-07-16 ENCOUNTER — Encounter: Payer: Self-pay | Admitting: Surgery

## 2016-07-16 ENCOUNTER — Ambulatory Visit (INDEPENDENT_AMBULATORY_CARE_PROVIDER_SITE_OTHER): Payer: BC Managed Care – PPO | Admitting: Surgery

## 2016-07-16 VITALS — BP 148/89 | HR 72 | Temp 98.5°F | Ht 73.0 in | Wt 176.5 lb

## 2016-07-16 DIAGNOSIS — K5732 Diverticulitis of large intestine without perforation or abscess without bleeding: Secondary | ICD-10-CM | POA: Diagnosis not present

## 2016-07-16 MED ORDER — CIPROFLOXACIN HCL 500 MG PO TABS: 500.0000 mg | ORAL_TABLET | Freq: Two times a day (BID) | ORAL | 0 refills | Status: DC

## 2016-07-16 MED ORDER — METRONIDAZOLE 500 MG PO TABS: 500.0000 mg | ORAL_TABLET | Freq: Three times a day (TID) | ORAL | 0 refills | Status: DC

## 2016-07-16 NOTE — Patient Instructions (Addendum)
We have sent your antibiotics to your pharmacy. Please start taking these as soon as you pick them up.  If you are not feeling any better by Thursday, please call our office so that we can send you for labs and a CT scan.  Please follow-up with Dr. Dahlia Byes as scheduled.

## 2016-07-16 NOTE — Progress Notes (Signed)
Subjective:     Patient ID: Connor Stone, male   DOB: 1971-05-20, 45 y.o.   MRN: [id number]  HPI 45 year old male who has a history of diverticulitis and was supposed to follow-up with Dr. Dahlia Byes in a couple of weeks to discuss resection came in today with abdominal pain. Patient states she's been having abdominal pain is lower abdomen worse on the left than in the right and can feel the pressure down in his pelvis. Patient states his pain is cramping in nature and is intermittent. Patient states when it comes on that it can be about a 7 out of 10 in intensity. The patient states that he had been having normal bowel movements without any constipation prior to this episode. Patient states that since Thursday he has been having some diarrhea as well. Patient states that he also had some decrease of appetite and did have some chills although he has been taking his fever at home and has not had a fever. Patient states that the pain is nowhere close to how bad her back in May when he had a abscess. Patient did have a colonoscopy last year in October 2016 which did show one polyp which was precancerous according to a note by Dr. Hilarie Fredrickson.  Past Medical History:  Diagnosis Date  . BRCA1 positive   . DVT (deep venous thrombosis) (HCC)    right leg  . Kidney stones   . Post-operative nausea and vomiting    Past Surgical History:  Procedure Laterality Date  . KNEE ARTHROSCOPY W/ ACL RECONSTRUCTION Right    Family History  Problem Relation Age of Onset  . Breast cancer Mother 27    bilateral breast cancer; d. 68; no genetic testing  . Prostate cancer Father 41    no genetic testing and is an only child with a mother with cancer  . Diabetes Father   . Breast cancer Sister 70     BRCA1 positive, sister is Destyn Schuyler (DOB 06/27/70) tested at [facility] with a report date of 01/13/2015. Result is positive for a pathogenic mutation called BRCA1, p.C61G.  . Cancer Paternal Grandmother 23    unknown type of rare  cancer   Social History   Social History  . Marital status: Married    Spouse name: N/A  . Number of children: 1  . Years of education: N/A   Occupational History  . scholl teacher    Social History Main Topics  . Smoking status: Never Smoker  . Smokeless tobacco: Never Used  . Alcohol use 0.0 oz/week     Comment: 2-4 beers / daily  . Drug use: No  . Sexual activity: Yes   Other Topics Concern  . None   Social History Narrative   Married   PE teacher in middle school   82 year old daughter   Brother is an Therapist, art    Current Outpatient Prescriptions:  .  dicyclomine (BENTYL) 20 MG tablet, Take 20 mg by mouth 2 (two) times daily as needed (for cramping). Reported on 03/28/2016, Disp: , Rfl:  .  ciprofloxacin (CIPRO) 500 MG tablet, Take 1 tablet (500 mg total) by mouth 2 (two) times daily., Disp: 28 tablet, Rfl: 0 .  metroNIDAZOLE (FLAGYL) 500 MG tablet, Take 1 tablet (500 mg total) by mouth 3 (three) times daily., Disp: 42 tablet, Rfl: 0 No Known Allergies    Review of Systems  Constitutional: Positive for appetite change, chills and fatigue. Negative for activity change, fever and  unexpected weight change.  HENT: Negative for congestion and sore throat.   Respiratory: Negative for cough, choking, chest tightness and shortness of breath.   Cardiovascular: Negative for chest pain, palpitations and leg swelling.  Gastrointestinal: Positive for abdominal distention, abdominal pain and diarrhea. Negative for anal bleeding, blood in stool, constipation, nausea, rectal pain and vomiting.  Genitourinary: Negative for dysuria, frequency and hematuria.  Musculoskeletal: Negative for back pain and neck pain.  Skin: Negative for color change, pallor, rash and wound.  Neurological: Negative for dizziness and weakness.  Hematological: Negative for adenopathy. Does not bruise/bleed easily.  Psychiatric/Behavioral: Negative for agitation. The patient is not nervous/anxious.   All  other systems reviewed and are negative.      Vitals:   07/16/16 1110  BP: (!) 148/89  Pulse: 72  Temp: 98.5 F (36.9 C)    Objective:   Physical Exam  Constitutional: He is oriented to person, place, and time. He appears well-developed and well-nourished. No distress.  HENT:  Head: Normocephalic and atraumatic.  Right Ear: External ear normal.  Left Ear: External ear normal.  Nose: Nose normal.  Mouth/Throat: Oropharynx is clear and moist. No oropharyngeal exudate.  Eyes: Conjunctivae and EOM are normal. Pupils are equal, round, and reactive to light. No scleral icterus.  Neck: Normal range of motion. Neck supple. No tracheal deviation present.  Cardiovascular: Normal rate, regular rhythm, normal heart sounds and intact distal pulses.  Exam reveals no gallop and no friction rub.   No murmur heard. Pulmonary/Chest: Effort normal and breath sounds normal. No respiratory distress. He has no wheezes. He has no rales.  Abdominal: Soft. Bowel sounds are normal. He exhibits no distension. There is tenderness. There is no rebound and no guarding.  Tenderness in LLQ and some in RLQ and suprapubic, no masses, rebound or gaurding  Musculoskeletal: Normal range of motion. He exhibits no edema, tenderness or deformity.  Neurological: He is alert and oriented to person, place, and time. No cranial nerve deficit.  Skin: Skin is warm and dry.  Psychiatric: He has a normal mood and affect. His behavior is normal. Judgment and thought content normal.  Vitals reviewed.      Assessment:     45 yr old male with recurrent diverticulitis     Plan:     I have personally reviewed the patient's past medical history to include his colonoscopy as well as a last bout of diverticulitis. I have personally reviewed his last laboratory values which were within normal limits upon leaving the hospital. I also reviewed his last CT scan which did show diverticulitis and a 2.5 cm abscess at that time. Also  reviewed the radiology reads that were in the chart.  I believe that he has had a recurrent bout of his diverticulitis in his early on in the process. We'll start him on some Cipro and Flagyl. The patient is instructed to avoid any nuts seeds quarter popcorn that could irritate the area during this period. I also instructed the patient to give [place] a call Thursday if he has not had some improvement in the pain and symptoms and we will get labs and a CT scan on him if that's the case. However if the patient does improve with antibiotics I will have him follow-up with his scheduled appoint with Dr. Dahlia Byes on October 4.

## 2016-07-26 ENCOUNTER — Ambulatory Visit (INDEPENDENT_AMBULATORY_CARE_PROVIDER_SITE_OTHER): Payer: BC Managed Care – PPO | Admitting: Family Medicine

## 2016-07-26 ENCOUNTER — Encounter: Payer: Self-pay | Admitting: Family Medicine

## 2016-07-26 DIAGNOSIS — R21 Rash and other nonspecific skin eruption: Secondary | ICD-10-CM | POA: Diagnosis not present

## 2016-07-26 MED ORDER — HYDROCODONE-ACETAMINOPHEN 5-325 MG PO TABS: 1.0000 | ORAL_TABLET | Freq: Four times a day (QID) | ORAL | 0 refills | Status: DC | PRN

## 2016-07-26 MED ORDER — VALACYCLOVIR HCL 1 G PO TABS: 1000.0000 mg | ORAL_TABLET | Freq: Three times a day (TID) | ORAL | 0 refills | Status: DC

## 2016-07-26 NOTE — Progress Notes (Signed)
Subjective:   Patient ID: Connor Stone, male    DOB: 1971/02/09, 45 y.o.   MRN: [id number]  Connor Stone is a pleasant 45 y.o. year old male who presents to clinic today with Rash  on 07/26/2016  HPI:  Painful rash appeared yesterday, started on his left lower back, now along lateral edge of left leg.  Not itchy.  Pain comes in "spurts" and is a 10/10.  Never had anything like this before.  Current Outpatient Prescriptions on File Prior to Visit  Medication Sig Dispense Refill  . ciprofloxacin (CIPRO) 500 MG tablet Take 1 tablet (500 mg total) by mouth 2 (two) times daily. 28 tablet 0  . dicyclomine (BENTYL) 20 MG tablet Take 20 mg by mouth 2 (two) times daily as needed (for cramping). Reported on 03/28/2016    . metroNIDAZOLE (FLAGYL) 500 MG tablet Take 1 tablet (500 mg total) by mouth 3 (three) times daily. 42 tablet 0   No current facility-administered medications on file prior to visit.     No Known Allergies  Past Medical History:  Diagnosis Date  . BRCA1 positive   . DVT (deep venous thrombosis) (HCC)    right leg  . Kidney stones   . Post-operative nausea and vomiting     Past Surgical History:  Procedure Laterality Date  . KNEE ARTHROSCOPY W/ ACL RECONSTRUCTION Right     Family History  Problem Relation Age of Onset  . Breast cancer Mother 7    bilateral breast cancer; d. 46; no genetic testing  . Prostate cancer Father 14    no genetic testing and is an only child with a mother with cancer  . Diabetes Father   . Breast cancer Sister 43     BRCA1 positive, sister is Connor Stone (DOB 06/27/70) tested at [facility] with a report date of 01/13/2015. Result is positive for a pathogenic mutation called BRCA1, p.C61G.  . Cancer Paternal Grandmother 12    unknown type of rare cancer    Social History   Social History  . Marital status: Married    Spouse name: N/A  . Number of children: 1  . Years of education: N/A   Occupational History  . scholl teacher     Social History Main Topics  . Smoking status: Never Smoker  . Smokeless tobacco: Never Used  . Alcohol use 0.0 oz/week     Comment: 2-4 beers / daily  . Drug use: No  . Sexual activity: Yes   Other Topics Concern  . Not on file   Social History Narrative   Married   PE teacher in middle school   85 year old daughter   Brother is an Therapist, art   The PMH, PSH, Social History, Family History, Medications, and allergies have been reviewed in [facility], and have been updated if relevant.   Review of Systems  Skin: Positive for rash.  Allergic/Immunologic: Negative for immunocompromised state.  All other systems reviewed and are negative.      Objective:    BP 128/62   Pulse 76   Temp 97.6 F (36.4 C) (Oral)   Wt 173 lb 12 oz (78.8 kg)   SpO2 97%   BMI 22.92 kg/m    Physical Exam  Constitutional: He is oriented to person, place, and time. He appears well-developed and well-nourished. No distress.  HENT:  Head: Normocephalic.  Eyes: Conjunctivae are normal.  Cardiovascular: Normal rate.   Pulmonary/Chest: Effort normal.  Musculoskeletal: Normal range  of motion.  Neurological: He is alert and oriented to person, place, and time. No cranial nerve deficit.  Skin: He is not diaphoretic.     Psychiatric: He has a normal mood and affect. His behavior is normal. Judgment and thought content normal.  Nursing note and vitals reviewed.         Assessment & Plan:   Rash and nonspecific skin eruption No Follow-up on file.

## 2016-07-26 NOTE — Patient Instructions (Signed)

## 2016-07-26 NOTE — Progress Notes (Signed)
Pre visit review using our clinic review tool, if applicable. No additional management support is needed unless otherwise documented below in the visit note. 

## 2016-07-26 NOTE — Assessment & Plan Note (Signed)
New- consistent with shingles. eRx sent for valtrex 1 gram three times daily x 7 days. Rx for Norco as needed for severe pain. Discussed course and precautions- see AVS. Call or return to clinic prn if these symptoms worsen or fail to improve as anticipated. The patient indicates understanding of these issues and agrees with the plan.

## 2016-07-30 ENCOUNTER — Telehealth: Payer: Self-pay | Admitting: *Deleted

## 2016-07-30 NOTE — Telephone Encounter (Signed)
Patient called stating that he was seen last week and diagnosed with shingles. Patient stated that he started Saturday night with flu-like symptoms. Patient stated that he has had chills, headache, nausea and diarrhea, but no fever. Patient wonders if these are symptoms of shingles or could something else be going on? Patient stated that he is also being treated for diverticulitis. Please advise.

## 2016-07-30 NOTE — Telephone Encounter (Signed)
It is difficult to say.  Certainly, the shingles can make you feel bad but I wonder with all of those symptoms if he also has a GI virus or symptoms of his diverticulitis.  Please keep [place] updated with his symptoms.

## 2016-07-31 NOTE — Telephone Encounter (Signed)
Spoke to pt and advised per Dr Deborra Medina. States that he f/u with GI 10/4 and will keep [place] updated with s/s

## 2016-08-01 ENCOUNTER — Ambulatory Visit (INDEPENDENT_AMBULATORY_CARE_PROVIDER_SITE_OTHER): Payer: BC Managed Care – PPO | Admitting: Surgery

## 2016-08-01 ENCOUNTER — Telehealth: Payer: Self-pay

## 2016-08-01 ENCOUNTER — Other Ambulatory Visit: Payer: Self-pay

## 2016-08-01 ENCOUNTER — Ambulatory Visit
Admission: RE | Admit: 2016-08-01 | Discharge: 2016-08-01 | Disposition: A | Discharge status: Home / Self Care | Payer: BC Managed Care – PPO | Source: Ambulatory Visit | Attending: Surgery | Admitting: Surgery

## 2016-08-01 ENCOUNTER — Encounter: Payer: Self-pay | Admitting: Surgery

## 2016-08-01 ENCOUNTER — Other Ambulatory Visit
Admission: RE | Admit: 2016-08-01 | Discharge: 2016-08-01 | Disposition: A | Discharge status: Home / Self Care | Payer: BC Managed Care – PPO | Source: Ambulatory Visit | Attending: Surgery | Admitting: Surgery

## 2016-08-01 VITALS — BP 138/89 | HR 74 | Temp 98.4°F | Ht 73.0 in | Wt 169.8 lb

## 2016-08-01 DIAGNOSIS — K5732 Diverticulitis of large intestine without perforation or abscess without bleeding: Secondary | ICD-10-CM | POA: Diagnosis present

## 2016-08-01 DIAGNOSIS — N200 Calculus of kidney: Secondary | ICD-10-CM | POA: Diagnosis not present

## 2016-08-01 LAB — COMPREHENSIVE METABOLIC PANEL
ALT: 28 U/L (ref 17–63)
ANION GAP: 5 (ref 5–15)
AST: 25 U/L (ref 15–41)
Albumin: 4.4 g/dL (ref 3.5–5.0)
Alkaline Phosphatase: 49 U/L (ref 38–126)
BILIRUBIN TOTAL: 0.6 mg/dL (ref 0.3–1.2)
BUN: 12 mg/dL (ref 6–20)
CO2: 31 mmol/L (ref 22–32)
Calcium: 9.3 mg/dL (ref 8.9–10.3)
Chloride: 104 mmol/L (ref 101–111)
Creatinine, Ser: 0.9 mg/dL (ref 0.61–1.24)
Glucose, Bld: 89 mg/dL (ref 65–99)
POTASSIUM: 4.9 mmol/L (ref 3.5–5.1)
Sodium: 140 mmol/L (ref 135–145)
TOTAL PROTEIN: 7.6 g/dL (ref 6.5–8.1)

## 2016-08-01 LAB — CBC WITH DIFFERENTIAL/PLATELET
Basophils Absolute: 0 10*3/uL (ref 0–0.1)
Basophils Relative: 1 %
EOS ABS: 0.1 10*3/uL (ref 0–0.7)
EOS PCT: 1 %
HCT: 46 % (ref 40.0–52.0)
Hemoglobin: 16.2 g/dL (ref 13.0–18.0)
LYMPHS ABS: 1.6 10*3/uL (ref 1.0–3.6)
LYMPHS PCT: 25 %
MCH: 31.3 pg (ref 26.0–34.0)
MCHC: 35.3 g/dL (ref 32.0–36.0)
MCV: 88.7 fL (ref 80.0–100.0)
Monocytes Absolute: 0.5 10*3/uL (ref 0.2–1.0)
Monocytes Relative: 9 %
NEUTROS ABS: 4.2 10*3/uL (ref 1.4–6.5)
NEUTROS PCT: 64 %
PLATELETS: 225 10*3/uL (ref 150–440)
RBC: 5.19 MIL/uL (ref 4.40–5.90)
RDW: 12.9 % (ref 11.5–14.5)
WBC: 6.4 10*3/uL (ref 3.8–10.6)

## 2016-08-01 MED ORDER — IOPAMIDOL (ISOVUE-300) INJECTION 61%
100.0000 mL | Freq: Once | INTRAVENOUS | Status: AC | PRN
  Administered 2016-08-01: 100 mL via INTRAVENOUS

## 2016-08-01 MED ORDER — METRONIDAZOLE 500 MG PO TABS: 500.0000 mg | ORAL_TABLET | Freq: Three times a day (TID) | ORAL | 0 refills | Status: DC

## 2016-08-01 MED ORDER — AMOXICILLIN-POT CLAVULANATE 875-125 MG PO TABS: 1.0000 | ORAL_TABLET | Freq: Two times a day (BID) | ORAL | 0 refills | Status: DC

## 2016-08-01 NOTE — Patient Instructions (Signed)
Also have your labs drawn in the medical mall prior to leaving.We have scheduled you for a CT Scan of your Abdomen and Pelvis. Please go to [address] imaging location after your labs are drawn. They will hold you in this location until the image has been read and have spoken with the surgeon.  Directions to Medical Mall: When leaving our office, go right. Go all of the way down to the very end of the hallway. You will have a purple wall in front of you. You will now have a tunnel to the hospital on your left hand side. Go through this tunnel and the elevators will be on your left. Go down to the 1st floor and take a slight left. The very first desk on the right hand side is the registration desk.  Address to imaging center: [address], [place], [place] [address]

## 2016-08-01 NOTE — Progress Notes (Signed)
Outpatient Surgical Follow Up  08/01/2016  Connor Stone is an 44 y.o. male.   Chief Complaint  Patient presents with  . Follow-up    Diverticulitis    HPI: 2 Connor Stone's following up for an episode of complicated diverticulitis back in May that was treated with antibiotics. And he did better Until 3 weeks ago where he had recurrence of his symptoms with left and right lower quadrant pain and acidosis. With an nausea and decreased appetite. Fortunately last week he developed shingles and has been on antiretroviral. Since then he has decreased energy, decreased appetite and now has some intermittent migraines. His pain is intermittent and is moderate in intensity and is dull in nature. He also has an additional pain related to his shingles that goes from his left hip all the way to the left knee. He has lost some weight and over the last 3 weeks and and he states that his symptoms have not gone completely away and for the last 3 weeks he did have some improvement of symptoms but they're still some pain that remains.  Past Medical History:  Diagnosis Date  . BRCA1 positive   . DVT (deep venous thrombosis) (HCC)    right leg  . Kidney stones   . Post-operative nausea and vomiting     Past Surgical History:  Procedure Laterality Date  . KNEE ARTHROSCOPY W/ ACL RECONSTRUCTION Right     Family History  Problem Relation Age of Onset  . Breast cancer Mother 36    bilateral breast cancer; d. 36; no genetic testing  . Prostate cancer Father 74    no genetic testing and is an only child with a mother with cancer  . Diabetes Father   . Breast cancer Sister 44     BRCA1 positive, sister is Connor Stone (DOB 06/27/70) tested at [facility] with a report date of 01/13/2015. Result is positive for a pathogenic mutation called BRCA1, p.C61G.  . Cancer Paternal Grandmother 70    unknown type of rare cancer    Social History:  reports that he has never smoked. He has never used smokeless tobacco. He  reports that he drinks alcohol. He reports that he does not use drugs.  Allergies: No Known Allergies  Medications reviewed.    ROS Full ROS performed and is otherwise negative other than what is stated in the HPI   BP 138/89   Pulse 74   Temp 98.4 F (36.9 C) (Oral)   Ht 6' 1" (1.854 m)   Wt 77 kg (169 lb 12.8 oz)   BMI 22.40 kg/m   Physical Exam  Constitutional: He is oriented to person, place, and time and well-developed, well-nourished, and in no distress. No distress.  Eyes: Conjunctivae are normal. No scleral icterus.  Neck: No JVD present. No tracheal deviation present.  Cardiovascular: Normal rate and intact distal pulses.   Pulmonary/Chest: Effort normal. No respiratory distress.  Abdominal: Soft. He exhibits no mass. There is tenderness. There is no rebound and no guarding.  TTP RLQ and LLQ , no peritonitis  Musculoskeletal: Normal range of motion. He exhibits no edema.  Neurological: He is alert and oriented to person, place, and time. Gait normal.  Skin: Skin is warm and dry. He is not diaphoretic.  Classic shingles lesions from left back , to left knee following dermatome  Psychiatric: Mood, memory, affect and judgment normal.  Nursing note and vitals reviewed.      No results found for this or any   previous visit (from the past 48 hour(s)). No results found.  Assessment/Plan:  1. Diverticulitis of colon We will need to re-image. Will obtain lab work.  Depending on the results may need further antibiotic therapy versus hospitalization. Discussed with the patient in detail and he understands. No need for immediate surgical revision at this time  - CT Abdomen Pelvis W Contrast; Future - CBC with Differential; Future - Comprehensive metabolic panel; Future     Connor Pabon, MD FACS General Surgeon  08/01/2016,10:07 AM 

## 2016-08-01 NOTE — Telephone Encounter (Signed)
Spoke with patient at this time. Explained that we were going to change antibiotics to Augmentin and Flagyl for 10 days. Follow-up 2 weeks. Appointment made with patient at this time. Reviewed signs and symptoms of perforation with patient and explained that if he becomes worse at all he needs to call me or go straight to the ER if our office is closed.  Medication sent to pharmacy.

## 2016-08-13 ENCOUNTER — Encounter: Payer: Self-pay | Admitting: Surgery

## 2016-08-13 ENCOUNTER — Ambulatory Visit (INDEPENDENT_AMBULATORY_CARE_PROVIDER_SITE_OTHER): Payer: BC Managed Care – PPO | Admitting: Surgery

## 2016-08-13 VITALS — BP 138/80 | HR 69 | Temp 97.8°F | Ht 73.0 in | Wt 173.0 lb

## 2016-08-13 DIAGNOSIS — K5732 Diverticulitis of large intestine without perforation or abscess without bleeding: Secondary | ICD-10-CM

## 2016-08-13 MED ORDER — BISACODYL 5 MG PO TBEC: 5.0000 mg | DELAYED_RELEASE_TABLET | Freq: Once | ORAL | 0 refills | Status: AC

## 2016-08-13 MED ORDER — NEOMYCIN SULFATE 500 MG PO TABS: 1000.0000 mg | ORAL_TABLET | Freq: Three times a day (TID) | ORAL | 0 refills | Status: DC

## 2016-08-13 MED ORDER — POLYETHYLENE GLYCOL 3350 17 GM/SCOOP PO POWD: 1.0000 | Freq: Once | ORAL | 0 refills | Status: AC

## 2016-08-13 MED ORDER — ERYTHROMYCIN BASE 500 MG PO TABS: 500.0000 mg | ORAL_TABLET | Freq: Three times a day (TID) | ORAL | 0 refills | Status: DC

## 2016-08-13 NOTE — Patient Instructions (Signed)
We will schedule your surgery on 09/05/16. Please see Blue sheet with surgery information. Please pick up your medicices at the pharmacy. Please call our office if you have questions or concerns.

## 2016-08-13 NOTE — Addendum Note (Signed)
Addended by: Wayna Chalet on: 08/13/2016 12:30 PM   Modules accepted: Orders

## 2016-08-13 NOTE — Addendum Note (Signed)
Addended by: Celene Kras on: 08/13/2016 11:14 AM   Modules accepted: Orders

## 2016-08-13 NOTE — Progress Notes (Signed)
Patient ID: Connor Stone, male   DOB: 1971-10-23, 45 y.o.   MRN: [id number]  HPI Connor Stone is a 45 y.o. male known to me with a previous history of complicated diverticulitis with abscess. 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 Dr.Wohl has evaluated him and determining that he did not need any further colonoscopy before sigmoid colectomy. He now is recover from her shingles episode and from another episode of diverticulitis. He is having bowel movements, he is taking by mouth, he does have some chronic intermittent mill and dull lower abdominal pain that is improving. Pain does not radiate and there is no specific alleviating or aggravating factors  No fevers no chills HPI  Past Medical History:  Diagnosis Date  . BRCA1 positive   . DVT (deep venous thrombosis) (HCC)    right leg  . Kidney stones   . Post-operative nausea and vomiting     Past Surgical History:  Procedure Laterality Date  . KNEE ARTHROSCOPY W/ ACL RECONSTRUCTION Right     Family History  Problem Relation Age of Onset  . Breast cancer Mother 47    bilateral breast cancer; d. 24; no genetic testing  . Prostate cancer Father 11    no genetic testing and is an only child with a mother with cancer  . Diabetes Father   . Breast cancer Sister 63     BRCA1 positive, sister is Orvis Stann (DOB 06/27/70) tested at [facility] with a report date of 01/13/2015. Result is positive for a pathogenic mutation called BRCA1, p.C61G.  . Cancer Paternal Grandmother 75    unknown type of rare cancer    Social History Social History  Substance Use Topics  . Smoking status: Never Smoker  . Smokeless tobacco: Never Used  . Alcohol use 0.0 oz/week     Comment: 2-4 beers / daily    No Known Allergies  Current Outpatient Prescriptions  Medication Sig Dispense Refill  . dicyclomine (BENTYL) 20 MG tablet Take 20 mg by mouth 2 (two) times daily as needed (for cramping). Reported on 03/28/2016    .  ibuprofen (ADVIL,MOTRIN) 400 MG tablet Take 400 mg by mouth every 6 (six) hours as needed.     No current facility-administered medications for this visit.      Review of Systems A 10 point review of systems was asked and was negative except for the information on the HPI  Physical Exam Blood pressure 138/80, pulse 69, temperature 97.8 F (36.6 C), temperature source Oral, height _0  (1.854 m), weight 78.5 kg (173 lb). CONSTITUTIONAL: NAD, awake alert EYES: Pupils are equal, round, and reactive to light, Sclera are non-icteric. EARS, NOSE, MOUTH AND THROAT: The oropharynx is clear. The oral mucosa is pink and moist. Hearing is intact to voice. LYMPH NODES:  Lymph nodes in the neck are normal. RESPIRATORY:  Lungs are clear. There is normal respiratory effort, with equal breath sounds bilaterally, and without pathologic use of accessory muscles. CARDIOVASCULAR: Heart is regular without murmurs, gallops, or rubs. GI: The abdomen is  soft, nontender, and nondistended. There are no palpable masses. There is no hepatosplenomegaly. There are normal bowel sounds in all quadrants. GU: Rectal deferred.   MUSCULOSKELETAL: Normal muscle strength and tone. No cyanosis or edema.   SKIN: Turgor is good and there are no pathologic skin lesions or ulcers. NEUROLOGIC: Motor and sensation is grossly normal. Cranial nerves are grossly intact. PSYCH:  Oriented to person, place and  time. Affect is normal.  Data Reviewed  I have personally reviewed the patient's imaging, laboratory findings and medical records.    Assessment/Plan Recurrent complicated diverticulitis in need for sigmoid colectomy. Discussed with the patient in detail about proposed procedure. Risks, benefits and possible complications including but not limited to: Leading, anastomotic leak, creation of an ostomy, prolonged hospitalization, re-interventions. He understands and wishes to proceed. Because of previous side effects general  anesthetic created nausea we will ask for preanesthesia evaluation. Extensive  counseling provided. Plan for Hand Assisted lap sigmoid colectomy possible ostomy possible open.  Caroleen Hamman, MD FACS General Surgeon 08/13/2016, 10:54 AM

## 2016-08-15 ENCOUNTER — Telehealth: Payer: Self-pay | Admitting: Surgery

## 2016-08-15 NOTE — Telephone Encounter (Signed)
Pt advised of pre op date/time and sx date. Sx: 09/05/16 with Dr Pabon--Dr Azalee Course assisting--laparoscopic sigmoid colectomy.  Pre op: 08/28/16 @ 8:30am--office.   Patient made aware to call [phone], between 1-3:00pm the day before surgery, to find out what time to arrive.

## 2016-08-22 ENCOUNTER — Telehealth: Payer: Self-pay

## 2016-08-22 NOTE — Telephone Encounter (Signed)
Patient had called me in reference to his FMLA forms. I returned his call and he stated that he wanted to make sure that I had received his FMLA form. He also wanted to know when I would fill them out. I told him that I would fill them out after he had his surgery because we wanted to make sure that he had his surgery so we don't have to change dates on his forms. Patient understood and had no further questions at this time.

## 2016-08-22 NOTE — Telephone Encounter (Signed)
Called patient but spoke with wife since he was at work. I told her that I fill out paperwork after they had surgery. However, I am able to write him a note stating that he will have surgery on September 05, 2016. Patient's wife understood and stated that it would be fine. She asked me to fax it to [phone].

## 2016-08-24 ENCOUNTER — Telehealth: Payer: Self-pay

## 2016-08-24 NOTE — Telephone Encounter (Signed)
FMLA Form was filled out and faxed per patient's request.

## 2016-08-27 ENCOUNTER — Encounter
Admission: RE | Admit: 2016-08-27 | Discharge: 2016-08-27 | Disposition: A | Discharge status: Home / Self Care | Payer: BC Managed Care – PPO | Source: Ambulatory Visit | Attending: Surgery | Admitting: Surgery

## 2016-08-27 DIAGNOSIS — Z01818 Encounter for other preprocedural examination: Secondary | ICD-10-CM | POA: Insufficient documentation

## 2016-08-27 HISTORY — DX: Personal history of urinary calculi: Z87.442

## 2016-08-27 HISTORY — DX: Family history of other specified conditions: Z84.89

## 2016-08-27 LAB — SURGICAL PCR SCREEN
MRSA, PCR: NEGATIVE
STAPHYLOCOCCUS AUREUS: NEGATIVE

## 2016-08-27 NOTE — Pre-Procedure Instructions (Signed)
SPOKE WITH MR  Connor Stone AFTER DISCUSSING ISSUE OF N/V/MIGRAINE POST OP ACL REPAIR 2011 OR 2012 WITH DR Ellender Hose. INFORMED PATIENT WILL ORDER TRANSDERM SCOPOLOMINE PATCH FOR AM SURGERY AND OTHER MEDS WILL BE GIVEN WILL TRY TO GET ANESTH. RECORD FROM PREVIOUS SURGERY.STATES HE IS ALSO HYPOGLYCEMIC AND HAS TO HAVE CLEAR LIQUIDS X 2 DAYS PREOP. ENCOURAGED TO CHOOSE BROTHS VS SUGARY LIQUIDS ESPECIALLY EVENING PRIOR TO SURGERY. WILL CHECK FBS AM SURGERY

## 2016-08-27 NOTE — Patient Instructions (Signed)
  Your procedure is scheduled on:Wednesday No.8 , 2017. Report to Same Day Surgery. To find out your arrival time please call [phone] between 1PM - 3PM on Tuesday Nov. 6, 2017.  Remember: Instructions that are not followed completely may result in serious medical risk, up to and including death, or upon the discretion of your surgeon and anesthesiologist your surgery may need to be rescheduled.    _x___ 1. Do not eat food or drink liquids after midnight. No gum chewing or hard candies.     _x__ 2. No Alcohol for 24 hours before or after surgery.   ____ 3. Bring all medications with you on the day of surgery if instructed.    __x__ 4. Notify your doctor if there is any change in your medical condition     (cold, fever, infections).    _____ 5. No smoking 24 hours prior to surgery.     Do not wear jewelry, make-up, hairpins, clips or nail polish.  Do not wear lotions, powders, or perfumes.   Do not shave 48 hours prior to surgery. Men may shave face and neck.  Do not bring valuables to the hospital.    [facility] is not responsible for any belongings or valuables.               Contacts, dentures or bridgework may not be worn into surgery.  Leave your suitcase in the car. After surgery it may be brought to your room.  For patients admitted to the hospital, discharge time is determined by your treatment team.   Patients discharged the day of surgery will not be allowed to drive home.    Please read over the following fact sheets that you were given:   [facility] Preparing for Surgery  _x_ Take these medicines the morning of surgery with A SIP OF WATER:    1. As instructed by Dr. Dahlia Byes   __x__ Bowel prep as directed by Dr. Dahlia Byes   __x__ Use CHG Soap as directed on instruction sheet  ____ Use inhalers on the day of surgery and bring to hospital day of surgery  ____ Stop metformin 2 days prior to surgery    ____ Take 1/2 of usual insulin dose the night before surgery  and none on the morning of  surgery.   ____ Stop Coumadin/Plavix/aspirin on *does not apply.  _x__ Stop Anti-inflammatories such as Advil, Aleve, Ibuprofen, Motrin, Naproxen,  Naprosyn, Goodies powders or aspirin products. OK to take Tylenol.   ____ Stop supplements until after surgery.    ____ Bring C-Pap to the hospital.

## 2016-08-27 NOTE — Pre-Procedure Instructions (Signed)
Called Angie at Dr. Corlis Leak office eariler to clarify the Tylenol 1,000 mg pre-op order, does Dr. Dahlia Byes want the med to be oral or IVPB?    Amber will clarity this order with Dr. Dahlia Byes.

## 2016-08-28 ENCOUNTER — Other Ambulatory Visit: Payer: BC Managed Care – PPO

## 2016-09-05 ENCOUNTER — Inpatient Hospital Stay
Admission: RE | Admit: 2016-09-05 | Discharge: 2016-09-08 | DRG: 330 | Disposition: A | Discharge status: Home / Self Care | Payer: BC Managed Care – PPO | Source: Ambulatory Visit | Attending: Surgery | Admitting: Surgery

## 2016-09-05 ENCOUNTER — Inpatient Hospital Stay: Payer: BC Managed Care – PPO | Admitting: Anesthesiology

## 2016-09-05 ENCOUNTER — Encounter
Admission: RE | Disposition: A | Discharge status: Home / Self Care | Payer: Self-pay | Source: Ambulatory Visit | Attending: Surgery

## 2016-09-05 ENCOUNTER — Encounter: Payer: Self-pay | Admitting: *Deleted

## 2016-09-05 DIAGNOSIS — Z833 Family history of diabetes mellitus: Secondary | ICD-10-CM

## 2016-09-05 DIAGNOSIS — Z8042 Family history of malignant neoplasm of prostate: Secondary | ICD-10-CM

## 2016-09-05 DIAGNOSIS — K660 Peritoneal adhesions (postprocedural) (postinfection): Secondary | ICD-10-CM | POA: Diagnosis present

## 2016-09-05 DIAGNOSIS — Z87442 Personal history of urinary calculi: Secondary | ICD-10-CM

## 2016-09-05 DIAGNOSIS — K921 Melena: Secondary | ICD-10-CM | POA: Diagnosis not present

## 2016-09-05 DIAGNOSIS — Z86010 Personal history of colonic polyps: Secondary | ICD-10-CM | POA: Diagnosis not present

## 2016-09-05 DIAGNOSIS — Z8719 Personal history of other diseases of the digestive system: Secondary | ICD-10-CM

## 2016-09-05 DIAGNOSIS — K5720 Diverticulitis of large intestine with perforation and abscess without bleeding: Secondary | ICD-10-CM

## 2016-09-05 DIAGNOSIS — Y92230 Patient room in hospital as the place of occurrence of the external cause: Secondary | ICD-10-CM | POA: Diagnosis not present

## 2016-09-05 DIAGNOSIS — T426X5A Adverse effect of other antiepileptic and sedative-hypnotic drugs, initial encounter: Secondary | ICD-10-CM | POA: Diagnosis not present

## 2016-09-05 DIAGNOSIS — Z9049 Acquired absence of other specified parts of digestive tract: Secondary | ICD-10-CM

## 2016-09-05 DIAGNOSIS — Z803 Family history of malignant neoplasm of breast: Secondary | ICD-10-CM

## 2016-09-05 DIAGNOSIS — Z79899 Other long term (current) drug therapy: Secondary | ICD-10-CM

## 2016-09-05 DIAGNOSIS — Z1509 Genetic susceptibility to other malignant neoplasm: Secondary | ICD-10-CM | POA: Diagnosis not present

## 2016-09-05 DIAGNOSIS — G251 Drug-induced tremor: Secondary | ICD-10-CM | POA: Diagnosis not present

## 2016-09-05 DIAGNOSIS — Z86718 Personal history of other venous thrombosis and embolism: Secondary | ICD-10-CM | POA: Diagnosis not present

## 2016-09-05 DIAGNOSIS — K5732 Diverticulitis of large intestine without perforation or abscess without bleeding: Secondary | ICD-10-CM | POA: Diagnosis present

## 2016-09-05 HISTORY — PX: LAPAROSCOPIC LYSIS OF ADHESIONS: SHX5905

## 2016-09-05 HISTORY — PX: LAPAROSCOPIC PARTIAL COLECTOMY: SHX5907

## 2016-09-05 LAB — CBC
HCT: 42.5 % (ref 40.0–52.0)
HEMOGLOBIN: 15 g/dL (ref 13.0–18.0)
MCH: 31.5 pg (ref 26.0–34.0)
MCHC: 35.3 g/dL (ref 32.0–36.0)
MCV: 89.3 fL (ref 80.0–100.0)
Platelets: 159 10*3/uL (ref 150–440)
RBC: 4.76 MIL/uL (ref 4.40–5.90)
RDW: 13.8 % (ref 11.5–14.5)
WBC: 16.4 10*3/uL — ABNORMAL HIGH (ref 3.8–10.6)

## 2016-09-05 LAB — CREATININE, SERUM
Creatinine, Ser: 0.95 mg/dL (ref 0.61–1.24)
GFR calc Af Amer: >60 mL/min (ref >60)
GFR calc non Af Amer: >60 mL/min (ref >60)

## 2016-09-05 LAB — GLUCOSE, CAPILLARY: Glucose-Capillary: 61 mg/dL — ABNORMAL LOW (ref 65–99)

## 2016-09-05 SURGERY — LAPAROSCOPIC PARTIAL COLECTOMY
Anesthesia: General | Site: Abdomen | Wound class: Clean Contaminated

## 2016-09-05 MED ORDER — ACETAMINOPHEN 10 MG/ML IV SOLN
INTRAVENOUS | Status: AC
  Filled 2016-09-05: qty 100

## 2016-09-05 MED ORDER — ENOXAPARIN SODIUM 40 MG/0.4ML ~~LOC~~ SOLN
40.0000 mg | SUBCUTANEOUS | Status: DC
  Administered 2016-09-06: 40 mg via SUBCUTANEOUS
  Filled 2016-09-05: qty 0.4

## 2016-09-05 MED ORDER — ONDANSETRON HCL 4 MG/2ML IJ SOLN
INTRAMUSCULAR | Status: DC | PRN
Start: 2016-09-05 — End: 2016-09-05
  Administered 2016-09-05: 4 mg via INTRAVENOUS

## 2016-09-05 MED ORDER — HEPARIN SODIUM (PORCINE) 5000 UNIT/ML IJ SOLN
INTRAMUSCULAR | Status: AC
  Administered 2016-09-05: 5000 [IU] via SUBCUTANEOUS
  Filled 2016-09-05: qty 1

## 2016-09-05 MED ORDER — CELECOXIB 200 MG PO CAPS
400.0000 mg | ORAL_CAPSULE | ORAL | Status: AC
  Administered 2016-09-05: 400 mg via ORAL

## 2016-09-05 MED ORDER — FENTANYL CITRATE (PF) 100 MCG/2ML IJ SOLN
25.0000 ug | INTRAMUSCULAR | Status: DC | PRN
  Administered 2016-09-05 (×2): 50 ug via INTRAVENOUS

## 2016-09-05 MED ORDER — GABAPENTIN 300 MG PO CAPS
300.0000 mg | ORAL_CAPSULE | ORAL | Status: AC
  Administered 2016-09-05: 300 mg via ORAL

## 2016-09-05 MED ORDER — KETOROLAC TROMETHAMINE 30 MG/ML IJ SOLN
INTRAMUSCULAR | Status: DC | PRN
  Administered 2016-09-05: 30 mg via INTRAVENOUS

## 2016-09-05 MED ORDER — KETOROLAC TROMETHAMINE 30 MG/ML IJ SOLN
30.0000 mg | Freq: Four times a day (QID) | INTRAMUSCULAR | Status: DC
  Administered 2016-09-05 – 2016-09-06 (×3): 30 mg via INTRAVENOUS
  Filled 2016-09-05 (×3): qty 1

## 2016-09-05 MED ORDER — SCOPOLAMINE 1 MG/3DAYS TD PT72
1.0000 | MEDICATED_PATCH | Freq: Once | TRANSDERMAL | Status: DC
  Administered 2016-09-05: 1.5 mg via TRANSDERMAL

## 2016-09-05 MED ORDER — CHLORHEXIDINE GLUCONATE CLOTH 2 % EX PADS: 6.0000 | MEDICATED_PAD | Freq: Once | CUTANEOUS | Status: DC

## 2016-09-05 MED ORDER — DIPHENHYDRAMINE HCL 12.5 MG/5ML PO ELIX: 12.5000 mg | ORAL_SOLUTION | Freq: Four times a day (QID) | ORAL | Status: DC | PRN

## 2016-09-05 MED ORDER — SCOPOLAMINE 1 MG/3DAYS TD PT72
MEDICATED_PATCH | TRANSDERMAL | Status: AC
  Administered 2016-09-05: 1.5 mg via TRANSDERMAL
  Filled 2016-09-05: qty 1

## 2016-09-05 MED ORDER — GABAPENTIN 600 MG PO TABS
300.0000 mg | ORAL_TABLET | Freq: Three times a day (TID) | ORAL | Status: DC
  Administered 2016-09-05 – 2016-09-07 (×5): 300 mg via ORAL
  Filled 2016-09-05 (×5): qty 1

## 2016-09-05 MED ORDER — FENTANYL CITRATE (PF) 100 MCG/2ML IJ SOLN
INTRAMUSCULAR | Status: DC | PRN
  Administered 2016-09-05: 50 ug via INTRAVENOUS
  Administered 2016-09-05: 100 ug via INTRAVENOUS
  Administered 2016-09-05 (×4): 50 ug via INTRAVENOUS

## 2016-09-05 MED ORDER — SODIUM CHLORIDE 0.9 % IV SOLN
1.0000 g | INTRAVENOUS | Status: AC
  Administered 2016-09-05: 1 g via INTRAVENOUS
  Filled 2016-09-05: qty 1

## 2016-09-05 MED ORDER — BUPIVACAINE LIPOSOME 1.3 % IJ SUSP
INTRAMUSCULAR | Status: AC
  Filled 2016-09-05: qty 20

## 2016-09-05 MED ORDER — OXYCODONE HCL 5 MG/5ML PO SOLN: 5.0000 mg | Freq: Once | ORAL | Status: DC | PRN

## 2016-09-05 MED ORDER — SODIUM CHLORIDE 0.9 % IJ SOLN
INTRAMUSCULAR | Status: DC | PRN
  Administered 2016-09-05: 50 mL

## 2016-09-05 MED ORDER — HEPARIN SODIUM (PORCINE) 5000 UNIT/ML IJ SOLN
5000.0000 [IU] | Freq: Once | INTRAMUSCULAR | Status: AC
  Administered 2016-09-05: 5000 [IU] via SUBCUTANEOUS

## 2016-09-05 MED ORDER — LACTATED RINGERS IV SOLN
INTRAVENOUS | Status: DC | PRN
  Administered 2016-09-05: 17:00:00 via INTRAVENOUS

## 2016-09-05 MED ORDER — DEXAMETHASONE SODIUM PHOSPHATE 10 MG/ML IJ SOLN
INTRAMUSCULAR | Status: DC | PRN
  Administered 2016-09-05: 10 mg via INTRAVENOUS

## 2016-09-05 MED ORDER — LACTATED RINGERS IV SOLN
INTRAVENOUS | Status: DC
  Administered 2016-09-05 (×3): via INTRAVENOUS

## 2016-09-05 MED ORDER — ACETAMINOPHEN 500 MG PO TABS
1000.0000 mg | ORAL_TABLET | Freq: Four times a day (QID) | ORAL | Status: DC
  Administered 2016-09-05 – 2016-09-08 (×11): 1000 mg via ORAL
  Filled 2016-09-05 (×12): qty 2

## 2016-09-05 MED ORDER — ONDANSETRON HCL 4 MG/2ML IJ SOLN: 4.0000 mg | Freq: Four times a day (QID) | INTRAMUSCULAR | Status: DC | PRN

## 2016-09-05 MED ORDER — BUPIVACAINE-EPINEPHRINE (PF) 0.5% -1:200000 IJ SOLN
INTRAMUSCULAR | Status: DC | PRN
  Administered 2016-09-05: 30 mL

## 2016-09-05 MED ORDER — PROMETHAZINE HCL 25 MG/ML IJ SOLN: 6.2500 mg | INTRAMUSCULAR | Status: DC | PRN

## 2016-09-05 MED ORDER — MORPHINE SULFATE (PF) 4 MG/ML IV SOLN: 2.0000 mg | INTRAVENOUS | Status: DC | PRN

## 2016-09-05 MED ORDER — METHOCARBAMOL 500 MG PO TABS: 500.0000 mg | ORAL_TABLET | Freq: Four times a day (QID) | ORAL | Status: DC | PRN

## 2016-09-05 MED ORDER — BUPIVACAINE-EPINEPHRINE (PF) 0.5% -1:200000 IJ SOLN
INTRAMUSCULAR | Status: AC
  Filled 2016-09-05: qty 30

## 2016-09-05 MED ORDER — ACETAMINOPHEN 500 MG PO TABS
ORAL_TABLET | ORAL | Status: AC
  Administered 2016-09-05: 1000 mg via ORAL
  Filled 2016-09-05: qty 2

## 2016-09-05 MED ORDER — KETAMINE HCL 50 MG/ML IJ SOLN
INTRAMUSCULAR | Status: DC | PRN
  Administered 2016-09-05: 50 mg via INTRAMUSCULAR

## 2016-09-05 MED ORDER — OXYCODONE HCL 5 MG PO TABS
5.0000 mg | ORAL_TABLET | ORAL | Status: DC | PRN
  Administered 2016-09-06 – 2016-09-07 (×3): 5 mg via ORAL
  Administered 2016-09-07 – 2016-09-08 (×2): 10 mg via ORAL
  Filled 2016-09-05: qty 2
  Filled 2016-09-05 (×2): qty 1
  Filled 2016-09-05: qty 2
  Filled 2016-09-05: qty 1

## 2016-09-05 MED ORDER — DEXTROSE IN LACTATED RINGERS 5 % IV SOLN
INTRAVENOUS | Status: DC
  Administered 2016-09-05: 1000 mL via INTRAVENOUS
  Administered 2016-09-06 – 2016-09-07 (×3): via INTRAVENOUS

## 2016-09-05 MED ORDER — BUPIVACAINE LIPOSOME 1.3 % IJ SUSP
INTRAMUSCULAR | Status: DC | PRN
  Administered 2016-09-05: 20 mL

## 2016-09-05 MED ORDER — FENTANYL CITRATE (PF) 100 MCG/2ML IJ SOLN
INTRAMUSCULAR | Status: AC
Start: 2016-09-05 — End: 2016-09-06
  Filled 2016-09-05: qty 2

## 2016-09-05 MED ORDER — DIPHENHYDRAMINE HCL 50 MG/ML IJ SOLN: 12.5000 mg | Freq: Four times a day (QID) | INTRAMUSCULAR | Status: DC | PRN

## 2016-09-05 MED ORDER — ONDANSETRON 4 MG PO TBDP: 4.0000 mg | ORAL_TABLET | Freq: Four times a day (QID) | ORAL | Status: DC | PRN

## 2016-09-05 MED ORDER — CELECOXIB 200 MG PO CAPS
ORAL_CAPSULE | ORAL | Status: AC
  Administered 2016-09-05: 400 mg via ORAL
  Filled 2016-09-05: qty 2

## 2016-09-05 MED ORDER — FAMOTIDINE 20 MG PO TABS
20.0000 mg | ORAL_TABLET | Freq: Once | ORAL | Status: AC
  Administered 2016-09-05: 20 mg via ORAL

## 2016-09-05 MED ORDER — FAMOTIDINE 20 MG PO TABS
ORAL_TABLET | ORAL | Status: AC
  Administered 2016-09-05: 20 mg via ORAL
  Filled 2016-09-05: qty 1

## 2016-09-05 MED ORDER — PROPOFOL 10 MG/ML IV BOLUS
INTRAVENOUS | Status: DC | PRN
  Administered 2016-09-05: 30 mg via INTRAVENOUS
  Administered 2016-09-05: 170 mg via INTRAVENOUS

## 2016-09-05 MED ORDER — ACETAMINOPHEN 10 MG/ML IV SOLN
INTRAVENOUS | Status: DC | PRN
  Administered 2016-09-05: 1000 mg via INTRAVENOUS

## 2016-09-05 MED ORDER — ACETAMINOPHEN 500 MG PO TABS
1000.0000 mg | ORAL_TABLET | ORAL | Status: AC
  Administered 2016-09-05: 1000 mg via ORAL

## 2016-09-05 MED ORDER — MIDAZOLAM HCL 2 MG/2ML IJ SOLN
INTRAMUSCULAR | Status: DC | PRN
  Administered 2016-09-05: 2 mg via INTRAVENOUS

## 2016-09-05 MED ORDER — SODIUM CHLORIDE 0.9 % IJ SOLN
INTRAMUSCULAR | Status: AC
  Filled 2016-09-05: qty 50

## 2016-09-05 MED ORDER — LIDOCAINE HCL (CARDIAC) 20 MG/ML IV SOLN
INTRAVENOUS | Status: DC | PRN
  Administered 2016-09-05: 100 mg via INTRAVENOUS

## 2016-09-05 MED ORDER — SODIUM CHLORIDE 0.9 % IJ SOLN
INTRAMUSCULAR | Status: AC
  Filled 2016-09-05: qty 10

## 2016-09-05 MED ORDER — OXYCODONE HCL 5 MG PO TABS: 5.0000 mg | ORAL_TABLET | Freq: Once | ORAL | Status: DC | PRN

## 2016-09-05 MED ORDER — ROCURONIUM BROMIDE 100 MG/10ML IV SOLN
INTRAVENOUS | Status: DC | PRN
  Administered 2016-09-05: 10 mg via INTRAVENOUS
  Administered 2016-09-05: 50 mg via INTRAVENOUS
  Administered 2016-09-05 (×3): 10 mg via INTRAVENOUS
  Administered 2016-09-05: 20 mg via INTRAVENOUS

## 2016-09-05 MED ORDER — GABAPENTIN 300 MG PO CAPS
ORAL_CAPSULE | ORAL | Status: AC
  Administered 2016-09-05: 300 mg via ORAL
  Filled 2016-09-05: qty 1

## 2016-09-05 SURGICAL SUPPLY — 76 items
APPLIER CLIP 5 13 M/L LIGAMAX5 (MISCELLANEOUS) ×3
BLADE SURG SZ10 CARB STEEL (BLADE) ×3 IMPLANT
BULB RESERV EVAC DRAIN JP 100C (MISCELLANEOUS) IMPLANT
CANISTER SUCT 1200ML W/VALVE (MISCELLANEOUS) ×3 IMPLANT
CATH FOL LEG HOLDER (MISCELLANEOUS) ×3 IMPLANT
CATH ROBINSON RED A/P 16FR (CATHETERS) ×6 IMPLANT
CATH TRAY 16F METER LATEX (MISCELLANEOUS) ×3 IMPLANT
CHLORAPREP W/TINT 26ML (MISCELLANEOUS) ×3 IMPLANT
CLIP APPLIE 5 13 M/L LIGAMAX5 (MISCELLANEOUS) ×1 IMPLANT
CNTNR SPEC 2.5X3XGRAD LEK (MISCELLANEOUS) ×1
CONT SPEC 4OZ STER OR WHT (MISCELLANEOUS) ×2
CONTAINER SPEC 2.5X3XGRAD LEK (MISCELLANEOUS) ×1 IMPLANT
DECANTER SPIKE VIAL GLASS SM (MISCELLANEOUS) ×3 IMPLANT
DEFOGGER SCOPE WARMER CLEARIFY (MISCELLANEOUS) ×3 IMPLANT
DRAIN CHANNEL JP 19F (MISCELLANEOUS) IMPLANT
DRAPE INCISE IOBAN 66X45 STRL (DRAPES) ×3 IMPLANT
DRAPE LEGGINS SURG 28X43 STRL (DRAPES) ×3 IMPLANT
DRAPE SHEET LG 3/4 BI-LAMINATE (DRAPES) ×3 IMPLANT
DRAPE UNDER BUTTOCK W/FLU (DRAPES) ×3 IMPLANT
ELECT BLADE 6.5 EXT (BLADE) IMPLANT
ELECT CAUTERY BLADE 6.4 (BLADE) ×3 IMPLANT
ELECT REM PT RETURN 9FT ADLT (ELECTROSURGICAL) ×3
ELECTRODE REM PT RTRN 9FT ADLT (ELECTROSURGICAL) ×1 IMPLANT
GELPORT LAPAROSCOPIC (MISCELLANEOUS) IMPLANT
GLOVE BIO SURGEON STRL SZ7 (GLOVE) ×48 IMPLANT
GOWN STRL REUS W/ TWL LRG LVL3 (GOWN DISPOSABLE) ×7 IMPLANT
GOWN STRL REUS W/TWL LRG LVL3 (GOWN DISPOSABLE) ×14
HANDLE SUCTION POOLE (INSTRUMENTS) ×1 IMPLANT
HANDLE YANKAUER SUCT BULB TIP (MISCELLANEOUS) ×3 IMPLANT
IRRIGATION STRYKERFLOW (MISCELLANEOUS) ×2 IMPLANT
IRRIGATOR STRYKERFLOW (MISCELLANEOUS) ×6
IV NS 1000ML (IV SOLUTION) ×4
IV NS 1000ML BAXH (IV SOLUTION) ×2 IMPLANT
KIT PINK PAD W/HEAD ARE REST (MISCELLANEOUS) ×3
KIT PINK PAD W/HEAD ARM REST (MISCELLANEOUS) ×1 IMPLANT
KIT RM TURNOVER CYSTO AR (KITS) ×3 IMPLANT
L-HOOK LAP DISP 36CM (ELECTROSURGICAL)
LHOOK LAP DISP 36CM (ELECTROSURGICAL) IMPLANT
LIQUID BAND (GAUZE/BANDAGES/DRESSINGS) ×3 IMPLANT
NEEDLE HYPO 22GX1.5 SAFETY (NEEDLE) ×3 IMPLANT
NS IRRIG 1000ML POUR BTL (IV SOLUTION) ×6 IMPLANT
PACK COLON CLEAN CLOSURE (MISCELLANEOUS) ×3 IMPLANT
PACK LAP CHOLECYSTECTOMY (MISCELLANEOUS) ×3 IMPLANT
PAD PREP 24X41 OB/GYN DISP (PERSONAL CARE ITEMS) ×3 IMPLANT
PENCIL ELECTRO HAND CTR (MISCELLANEOUS) ×3 IMPLANT
RELOAD BLUE (STAPLE) IMPLANT
RELOAD STAPLER BLUE 60MM (STAPLE) ×3 IMPLANT
RELOAD STAPLER WHITE 60MM (STAPLE) ×5 IMPLANT
SCISSORS METZENBAUM CVD 33 (INSTRUMENTS) IMPLANT
SHEARS HARMONIC ACE PLUS 36CM (ENDOMECHANICALS) ×6 IMPLANT
SLEEVE ENDOPATH XCEL 5M (ENDOMECHANICALS) ×3 IMPLANT
SOL PREP PVP 2OZ (MISCELLANEOUS) ×3
SOLUTION PREP PVP 2OZ (MISCELLANEOUS) ×1 IMPLANT
SPONGE DRAIN TRACH 4X4 STRL 2S (GAUZE/BANDAGES/DRESSINGS) ×3 IMPLANT
SPONGE LAP 18X18 5 PK (GAUZE/BANDAGES/DRESSINGS) ×9 IMPLANT
STAPLE ECHEON FLEX 60 POW ENDO (STAPLE) ×3 IMPLANT
STAPLER CIRCULAR 29MM (STAPLE) ×3 IMPLANT
STAPLER RELOAD BLUE 60MM (STAPLE) ×9
STAPLER RELOAD WHITE 60MM (STAPLE) ×15
STAPLER SKIN PROX 35W (STAPLE) IMPLANT
SUCT SIGMOIDOSCOPE TIP 18 W/TU (SUCTIONS) IMPLANT
SUCTION POOLE HANDLE (INSTRUMENTS) ×3
SURGILUBE 2OZ TUBE FLIPTOP (MISCELLANEOUS) ×9 IMPLANT
SUT ETHILON 3 0 PS 1 (SUTURE) ×3 IMPLANT
SUT MNCRL AB 4-0 PS2 18 (SUTURE) ×6 IMPLANT
SUT PDS AB 0 CT1 27 (SUTURE) ×6 IMPLANT
SUT PDS PLUS 0 (SUTURE) ×4
SUT PDS PLUS AB 0 CT-2 (SUTURE) ×2 IMPLANT
SUT SILK 0 SH 30 (SUTURE) IMPLANT
SUT SILK 2 0SH CR/8 30 (SUTURE) ×3 IMPLANT
SYR 30ML LL (SYRINGE) ×6 IMPLANT
SYRINGE IRR TOOMEY STRL 70CC (SYRINGE) ×6 IMPLANT
TOWEL OR 17X26 4PK STRL BLUE (TOWEL DISPOSABLE) ×6 IMPLANT
TROCAR XCEL 12X100 BLDLESS (ENDOMECHANICALS) ×3 IMPLANT
TROCAR XCEL NON-BLD 5MMX100MML (ENDOMECHANICALS) ×3 IMPLANT
TUBING INSUF HEATED (TUBING) ×3 IMPLANT

## 2016-09-05 NOTE — Anesthesia Procedure Notes (Signed)
Procedure Name: Intubation Date/Time: 09/05/2016 4:46 PM Performed by: Silvana Newness Pre-anesthesia Checklist: Patient identified, Emergency Drugs available, Suction available, Patient being monitored and Timeout performed Patient Re-evaluated:Patient Re-evaluated prior to inductionOxygen Delivery Method: Circle system utilized Preoxygenation: Pre-oxygenation with 100% oxygen Intubation Type: IV induction Ventilation: Mask ventilation without difficulty Laryngoscope Size: Mac and 4 Grade View: Grade I Tube type: Oral Tube size: 7.5 mm Number of attempts: 1 Airway Equipment and Method: Rigid stylet Placement Confirmation: ETT inserted through vocal cords under direct vision,  positive ETCO2 and breath sounds checked- equal and bilateral Secured at: 22 cm Tube secured with: Tape Dental Injury: Teeth and Oropharynx as per pre-operative assessment

## 2016-09-05 NOTE — Anesthesia Preprocedure Evaluation (Signed)
Anesthesia Evaluation  Patient identified by MRN, date of birth, ID band Patient awake    Reviewed: Allergy & Precautions, H&P , NPO status , Patient's Chart, lab work & pertinent test results  History of Anesthesia Complications (+) PONV, PROLONGED EMERGENCE, Family history of anesthesia reaction and history of anesthetic complications  Airway Mallampati: I  TM Distance: >3 FB Neck ROM: full    Dental no notable dental hx. (+) Poor Dentition, Chipped   Pulmonary neg pulmonary ROS, neg shortness of breath,    Pulmonary exam normal breath sounds clear to auscultation       Cardiovascular Exercise Tolerance: Good (-) angina(-) Past MI and (-) DOE negative cardio ROS Normal cardiovascular exam Rhythm:regular Rate:Normal     Neuro/Psych PSYCHIATRIC DISORDERS negative neurological ROS     GI/Hepatic negative GI ROS, Neg liver ROS,   Endo/Other  negative endocrine ROS  Renal/GU Renal disease     Musculoskeletal   Abdominal   Peds  Hematology negative hematology ROS (+)   Anesthesia Other Findings Past Medical History: No date: BRCA1 positive 2012: DVT (deep venous thrombosis) (HCC)     Comment: right leg No date: Family history of adverse reaction to anesthes* 2015: History of kidney stones No date: Post-operative nausea and vomiting  Past Surgical History: No date: KNEE ARTHROSCOPY W/ ACL RECONSTRUCTION Right  BMI    Body Mass Index:  21.51 kg/m      Reproductive/Obstetrics negative OB ROS                             Anesthesia Physical Anesthesia Plan  ASA: III  Anesthesia Plan: General ETT   Post-op Pain Management:    Induction:   Airway Management Planned:   Additional Equipment:   Intra-op Plan:   Post-operative Plan:   Informed Consent: I have reviewed the patients History and Physical, chart, labs and discussed the procedure including the risks, benefits and  alternatives for the proposed anesthesia with the patient or authorized representative who has indicated his/her understanding and acceptance.     Plan Discussed with: Anesthesiologist, CRNA and Surgeon  Anesthesia Plan Comments:         Anesthesia Quick Evaluation

## 2016-09-05 NOTE — Transfer of Care (Signed)
Immediate Anesthesia Transfer of Care Note  Patient: Connor Stone  Procedure(s) Performed: Procedure(s) with comments: LAPAROSCOPIC PARTIAL COLECTOMY Sigmoid colectomy, splenic flexure takedown Hand Assisted lap sigmoid colectomy (N/A) - Modified lithotomy w Yellow fin stirrups LAPAROSCOPIC LYSIS OF ADHESIONS (N/A)  Patient Location: PACU  Anesthesia Type:General  Level of Consciousness: awake, alert , oriented and patient cooperative  Airway & Oxygen Therapy: Patient Spontanous Breathing  Post-op Assessment: Report given to RN and Post -op Vital signs reviewed and stable  Post vital signs: Reviewed and stable  Last Vitals:  Vitals:   09/05/16 1509 09/05/16 2102  BP: (!) 150/84 (P) 137/70  Pulse: 82 (P) 82  Resp: 16 (P) 16  Temp: (!) 35.9 C (P) 36.7 C    Last Pain:  Vitals:   09/05/16 1509  TempSrc: Tympanic  PainSc: 0-No pain         Complications: No apparent anesthesia complications

## 2016-09-05 NOTE — Interval H&P Note (Signed)
History and Physical Interval Note:  09/05/2016 3:42 PM  Connor Stone  has presented today for surgery, with the diagnosis of diverticulitis  The various methods of treatment have been discussed with the patient and family. After consideration of risks, benefits and other options for treatment, the patient has consented to  Procedure(s) with comments: LAPAROSCOPIC PARTIAL COLECTOMY Sigmoid colectomy. Hand Assisted lap sigmoid colectomy possible ostomy possible open. (N/A) - Modified lithotomy w Yellow fin stirrups as a surgical intervention .  The patient's history has been reviewed, patient examined, no change in status, stable for surgery.  I have reviewed the patient's chart and labs.  Questions were answered to the patient's satisfaction.     [place]

## 2016-09-05 NOTE — H&P (View-Only) (Signed)
Patient ID: Connor Stone, male   DOB: 1971-10-23, 45 y.o.   MRN: [id number]  Connor Stone is a 45 y.o. male known to me with a previous history of complicated diverticulitis with abscess. 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 Dr.Wohl has evaluated him and determining that he did not need any further colonoscopy before sigmoid colectomy. He now is recover from her shingles episode and from another episode of diverticulitis. He is having bowel movements, he is taking by mouth, he does have some chronic intermittent mill and dull lower abdominal pain that is improving. Pain does not radiate and there is no specific alleviating or aggravating factors  No fevers no chills Connor  Past Medical History:  Diagnosis Date  . BRCA1 positive   . DVT (deep venous thrombosis) (HCC)    right leg  . Kidney stones   . Post-operative nausea and vomiting     Past Surgical History:  Procedure Laterality Date  . KNEE ARTHROSCOPY W/ ACL RECONSTRUCTION Right     Family History  Problem Relation Age of Onset  . Breast cancer Mother 47    bilateral breast cancer; d. 24; no genetic testing  . Prostate cancer Father 11    no genetic testing and is an only child with a mother with cancer  . Diabetes Father   . Breast cancer Sister 63     BRCA1 positive, sister is Orvis Stann (DOB 06/27/70) tested at [facility] with a report date of 01/13/2015. Result is positive for a pathogenic mutation called BRCA1, p.C61G.  . Cancer Paternal Grandmother 75    unknown type of rare cancer    Social History Social History  Substance Use Topics  . Smoking status: Never Smoker  . Smokeless tobacco: Never Used  . Alcohol use 0.0 oz/week     Comment: 2-4 beers / daily    No Known Allergies  Current Outpatient Prescriptions  Medication Sig Dispense Refill  . dicyclomine (BENTYL) 20 MG tablet Take 20 mg by mouth 2 (two) times daily as needed (for cramping). Reported on 03/28/2016    .  ibuprofen (ADVIL,MOTRIN) 400 MG tablet Take 400 mg by mouth every 6 (six) hours as needed.     No current facility-administered medications for this visit.      Review of Systems A 10 point review of systems was asked and was negative except for the information on the Connor  Physical Exam Blood pressure 138/80, pulse 69, temperature 97.8 F (36.6 C), temperature source Oral, height _0  (1.854 m), weight 78.5 kg (173 lb). CONSTITUTIONAL: NAD, awake alert EYES: Pupils are equal, round, and reactive to light, Sclera are non-icteric. EARS, NOSE, MOUTH AND THROAT: The oropharynx is clear. The oral mucosa is pink and moist. Hearing is intact to voice. LYMPH NODES:  Lymph nodes in the neck are normal. RESPIRATORY:  Lungs are clear. There is normal respiratory effort, with equal breath sounds bilaterally, and without pathologic use of accessory muscles. CARDIOVASCULAR: Heart is regular without murmurs, gallops, or rubs. GI: The abdomen is  soft, nontender, and nondistended. There are no palpable masses. There is no hepatosplenomegaly. There are normal bowel sounds in all quadrants. GU: Rectal deferred.   MUSCULOSKELETAL: Normal muscle strength and tone. No cyanosis or edema.   SKIN: Turgor is good and there are no pathologic skin lesions or ulcers. NEUROLOGIC: Motor and sensation is grossly normal. Cranial nerves are grossly intact. PSYCH:  Oriented to person, place and  time. Affect is normal.  Data Reviewed  I have personally reviewed the patient's imaging, laboratory findings and medical records.    Assessment/Plan Recurrent complicated diverticulitis in need for sigmoid colectomy. Discussed with the patient in detail about proposed procedure. Risks, benefits and possible complications including but not limited to: Leading, anastomotic leak, creation of an ostomy, prolonged hospitalization, re-interventions. He understands and wishes to proceed. Because of previous side effects general  anesthetic created nausea we will ask for preanesthesia evaluation. Extensive  counseling provided. Plan for Hand Assisted lap sigmoid colectomy possible ostomy possible open.  Caroleen Hamman, MD FACS General Surgeon 08/13/2016, 10:54 AM

## 2016-09-05 NOTE — Op Note (Signed)
PROCEDURES: 1. Laparoscopic lysis of adhesions taking at least 60 minutes of total operative time 2. Laparoscopic Low Anterior resection ( low anastomosis) 3. Laparoscopic takedown of splenic flexure  Pre-operative Diagnosis: Complicated diverticulitis  Post-operative Diagnosis: same  Surgeon: Marjory Lies Laydon Martis   Assistants: Dr. Hampton Abbot   Anesthesia: General endotracheal anesthesia  ASA Class: 2  Surgeon: Caroleen Hamman , MD FACS  Anesthesia: Gen. with endotracheal tube  Findings: Significant diverticular disease distal sigmoid Dense adhesion from sigmoid to pelvis and from sigmoid to SB  Estimated Blood Loss: 50cc         Drains: 15 Fr. Blake drain         Specimens: colon       Complications:  None               Condition: stable  Procedure Details  The patient was seen again in the Holding Room. The benefits, complications, treatment options, and expected outcomes were discussed with the patient. The risks of bleeding, infection, recurrence of symptoms, failure to resolve symptoms,  bowel injury, any of which could require further surgery were reviewed with the patient.   The patient was taken to Operating Room, identified as Connor Stone and the procedure verified.  A Time Out was held and the above information confirmed.  Prior to the induction of general anesthesia, antibiotic prophylaxis was administered. VTE prophylaxis was in place. General endotracheal anesthesia was then administered and tolerated well. After the induction, the abdomen was prepped with Chloraprep and draped in the sterile fashion. The patient was positioned in the supine position. Prior to the induction of general anesthesia, antibiotic prophylaxis was administered. VTE prophylaxis was in place. General endotracheal anesthesia was then administered and tolerated well. After the induction, the abdomen was prepped with Chloraprep and draped in the sterile fashion. The patient was positioned in lithotomy  position. 7 cm incision was created as a midline mini laparotomy. The abdominal cavity was entered under direct visualization and the GelPort device was placed. A 5 mm port was placed in the suprapubic area under direct visualization and pneumoperitoneum was obtained. There were dense adhesions from the omentum to the abdominal wall that where lysed in the standard fashion with the Harmonic scalpel. We also were able to place a 12 mm port in the right lower quadrant and a 5 mm port in the left lower quadrant under direct visualization. There was significant adhesive disease in the pelvis from the sigmoid to the pelvic wall and also from the sigmoid to the pelvis. This adhesions were lysed with a combination of finger fracturing and Harmonic scalpel. The white line of toldt was identified and divided and we mobilized the descending colon IN a lateral to medial fashion. We preserved the ureter at all times. We were also able to mobilize the splenic flexure using Harmonic scalpel in the standard fashion. We identified the take off of the inferior mesenteric artery dissected the pedicle and divided using a 60 mm vascular echelon stapler in the standard fashion. Using the Harmonic's scalpel were able to divide the mesorectum and and also divided proximal to the mesentery of the descending colon. Once we have an adequate visualization and mobilization we divided the sigmoid colon distally at the level of the mid rectum to have adequate distal margin, this was done with blue loads using the echelon stapler. Wel removes the GelPort and visualized the colon in a direct fashion and  divided at the mid descending colon with standard 60 mm  blue load. We opened the stapler linend measure the diameter of the bowel. A 29 mm dilator was perfect size. A pursestring was used after inserting the anvil device. I was able to pass a 29 mm standard EEA stapler device through the anus Under direct visualization we perform an end to end  anastomosis with the EEA device. A leak test was performed inflating the colon with a Toomey syringe and a rubber catheter, this is where Dr .Hampton Abbot helped me out to confirm adequate anastomosis, there was no leak.. There was also adequate hemostasis. A 15 Blake drain was placed in the pelvis. We were able to mobilize the omentum and I created an omental flap to attach it to the anastomosis. The drain was sutured in place with a 2-0 silk. All the laparoscopic ports were removed and a second look showed no evidence of any bleeding or any other injuries. We changed gloves and place a new tray to close the abdomen with a 0 PDS suture in a running fashion and the skin was closed with 4-0 Monocryl. Liposomal Marcaine was injected on all incision sites under direct visualization. Dermabond was used to coat all the skin incisions. Needle and laparotomy count were correct and there were no immediate occasions  Caroleen Hamman, MD, FACS

## 2016-09-06 ENCOUNTER — Encounter: Payer: Self-pay | Admitting: Surgery

## 2016-09-06 LAB — BASIC METABOLIC PANEL
ANION GAP: 5 (ref 5–15)
BUN: 12 mg/dL (ref 6–20)
CALCIUM: 8.6 mg/dL — AB (ref 8.9–10.3)
CHLORIDE: 104 mmol/L (ref 101–111)
CO2: 27 mmol/L (ref 22–32)
Creatinine, Ser: 0.94 mg/dL (ref 0.61–1.24)
GFR calc non Af Amer: >60 mL/min (ref >60)
Glucose, Bld: 192 mg/dL — ABNORMAL HIGH (ref 65–99)
POTASSIUM: 4.6 mmol/L (ref 3.5–5.1)
Sodium: 136 mmol/L (ref 135–145)

## 2016-09-06 LAB — CBC
HEMATOCRIT: 42.6 % (ref 40.0–52.0)
HEMOGLOBIN: 14.8 g/dL (ref 13.0–18.0)
MCH: 31.2 pg (ref 26.0–34.0)
MCHC: 34.7 g/dL (ref 32.0–36.0)
MCV: 90 fL (ref 80.0–100.0)
Platelets: 179 10*3/uL (ref 150–440)
RBC: 4.73 MIL/uL (ref 4.40–5.90)
RDW: 13.5 % (ref 11.5–14.5)
WBC: 14.1 10*3/uL — ABNORMAL HIGH (ref 3.8–10.6)

## 2016-09-06 MED ORDER — FAMOTIDINE 20 MG PO TABS
40.0000 mg | ORAL_TABLET | Freq: Two times a day (BID) | ORAL | Status: DC
  Administered 2016-09-06 – 2016-09-08 (×5): 40 mg via ORAL
  Filled 2016-09-06 (×5): qty 2

## 2016-09-06 NOTE — Progress Notes (Signed)
POD # 1 AVSS Good U/o Labs ok Pain controlled JP serosanguineous HE did pass some flatus, no N/V  PE NAD Abd: soft, incisions c/d/i,jp in place serosanguinous  A/P Doing very well Mobilize Continue clears DC foley

## 2016-09-06 NOTE — Progress Notes (Signed)
Pt made RN aware that he is having blood in his stools, RN assessed bowel movements and there is moderate amount of blood with bowel movement. Dr. Azalee Course was made aware of blood in bowel movements, no new orders given at this time.   Angus Seller

## 2016-09-06 NOTE — Progress Notes (Signed)
Pt.'s safety score requires pt to have bed alarm placed; however, pt refuses alarm. RN educated pt on the importance of bed alarm due to pt passing out frequently at home. Pt states he will call out when wants to ambulate to bathroom and hallway. Will continue to monitor pt.   Connor Stone

## 2016-09-06 NOTE — Anesthesia Postprocedure Evaluation (Signed)
Anesthesia Post Note  Patient: ALFONZO OVERMAN  Procedure(s) Performed: Procedure(s) (LRB): LAPAROSCOPIC PARTIAL COLECTOMY Sigmoid colectomy, splenic flexure takedown Hand Assisted lap sigmoid colectomy (N/A) LAPAROSCOPIC LYSIS OF ADHESIONS (N/A)  Patient location during evaluation: PACU Anesthesia Type: General Level of consciousness: awake and alert Pain management: pain level controlled Vital Signs Assessment: post-procedure vital signs reviewed and stable Respiratory status: spontaneous breathing, nonlabored ventilation, respiratory function stable and patient connected to nasal cannula oxygen Cardiovascular status: blood pressure returned to baseline and stable Postop Assessment: no signs of nausea or vomiting Anesthetic complications: no    Last Vitals:  Vitals:   09/05/16 2236 09/05/16 2343  BP: (!) 153/90 (!) 156/87  Pulse: 74 66  Resp: 20 20  Temp: 36.7 C 36.9 C    Last Pain:  Vitals:   09/05/16 2343  TempSrc: Oral  PainSc:                  Precious Haws [place]iscit[place]

## 2016-09-06 NOTE — Progress Notes (Signed)
Called by Nursing staff regarding blody BM. Apparently moderate dark bloody stool. No continuous evidence of active bleeding. No signs of active bleeding. HD adequate. HB was nml this am. HE is in good spirits, conversant Seen and examined Abdomen is soft, incision healing well, non distended, no peritonitis, JP w/o change in output. Rectal area no evidence of active bleeding, diaper dry.  A/P likely post anastomotic blody BM not unusual after colorectal surgery D/W Nursing staff and pt in detail We will hold off schedule Toradol and lovenox ( he did receive lovenox this am)  Encourage ambulation F/U Hb

## 2016-09-06 NOTE — Progress Notes (Signed)
Connor Stone student removed foley per order. RN was present upon removal. Pt is due to void by 1630 09/05/2016.   Angus Seller

## 2016-09-07 LAB — CBC
HEMATOCRIT: 33.3 % — AB (ref 40.0–52.0)
HEMATOCRIT: 32.8 % — AB (ref 40.0–52.0)
HEMOGLOBIN: 11.7 g/dL — AB (ref 13.0–18.0)
Hemoglobin: 11.6 g/dL — ABNORMAL LOW (ref 13.0–18.0)
MCH: 31.3 pg (ref 26.0–34.0)
MCH: 31.3 pg (ref 26.0–34.0)
MCHC: 35.2 g/dL (ref 32.0–36.0)
MCHC: 35.6 g/dL (ref 32.0–36.0)
MCV: 88.8 fL (ref 80.0–100.0)
MCV: 87.9 fL (ref 80.0–100.0)
PLATELETS: 150 10*3/uL (ref 150–440)
Platelets: 141 10*3/uL — ABNORMAL LOW (ref 150–440)
RBC: 3.75 MIL/uL — ABNORMAL LOW (ref 4.40–5.90)
RBC: 3.73 MIL/uL — ABNORMAL LOW (ref 4.40–5.90)
RDW: 13.6 % (ref 11.5–14.5)
RDW: 13.6 % (ref 11.5–14.5)
WBC: 11.6 10*3/uL — ABNORMAL HIGH (ref 3.8–10.6)
WBC: 11 10*3/uL — ABNORMAL HIGH (ref 3.8–10.6)

## 2016-09-07 LAB — PROTIME-INR
INR: 1.06
Prothrombin Time: 13.8 seconds (ref 11.4–15.2)

## 2016-09-07 LAB — SURGICAL PATHOLOGY

## 2016-09-07 LAB — APTT: aPTT: 27 seconds (ref 24–36)

## 2016-09-07 NOTE — Progress Notes (Signed)
Seen ane examined, tolerated full liquids, feeling well Good UO Had a recent brown bm, passing flatus Does have some tremors HB stable  PE NAD Abd: soft, no peritonitis, Jp serous  A/P Tremors likely from gabapentin. DC  Ambulate Soft diet

## 2016-09-07 NOTE — Progress Notes (Signed)
POD # 2 Main issue has been bloody BMs, about 8 last 24 hrs per pt , per Nursing there were 5. Reports that it was initially bright and then darker. Last time he had one was 2 hrs ago. There was a dropped in the Hb to 11.7. ( 3 gm) His PL are 141 from 225 HD are adequate normal bp and HR is normal. Good U/O JP 95 serous fluid HE is passing gas  PE NAD Abd: soft, No peritonitis, non distended, incisions c/d/i, no infection, no peritonitis, Appropiate incisional tenderness. JP serous output  A/P Post of bleed from anastomosis, I have stopped the lovenox and Toradol that likely contributed to this. He also had decrease in Pl that certainly did not help.  He is HD adequate and no signs of active bleeding. We will repeat a hb in afternoon and check coags No need for emergent surgical intervention at this time

## 2016-09-07 NOTE — Plan of Care (Signed)
Problem: Pain Managment: Goal: General experience of comfort will improve Outcome: Progressing Patient was educated on the importance of getting up in the chair which will improve his healing process

## 2016-09-07 NOTE — Plan of Care (Signed)
Problem: Nutrition: Goal: Adequate nutrition will be maintained Outcome: Progressing Patient was educated on upgrade to full liquid diet

## 2016-09-08 LAB — CBC
HEMATOCRIT: 30 % — AB (ref 40.0–52.0)
HEMOGLOBIN: 10.8 g/dL — AB (ref 13.0–18.0)
MCH: 32.4 pg (ref 26.0–34.0)
MCHC: 36 g/dL (ref 32.0–36.0)
MCV: 90 fL (ref 80.0–100.0)
Platelets: 137 10*3/uL — ABNORMAL LOW (ref 150–440)
RBC: 3.33 MIL/uL — AB (ref 4.40–5.90)
RDW: 13.5 % (ref 11.5–14.5)
WBC: 7.6 10*3/uL (ref 3.8–10.6)

## 2016-09-08 MED ORDER — OXYCODONE-ACETAMINOPHEN 5-325 MG PO TABS: 1.0000 | ORAL_TABLET | ORAL | 0 refills | Status: DC | PRN

## 2016-09-08 MED ORDER — IBUPROFEN 800 MG PO TABS: 800.0000 mg | ORAL_TABLET | Freq: Three times a day (TID) | ORAL | 0 refills | Status: DC | PRN

## 2016-09-08 NOTE — Progress Notes (Signed)
45yr old male POD#3 from Laparoscopic sigmoid colectomy.  He has been doing well.  He is tolerating soft diet.  He is up and moving well and walking in hallways. His pain is well controlled with po meds. He has not had any more blood pass but did have some brown that may have been stool.  He is passing gas.  Vitals:   09/08/16 0540 09/08/16 1247  BP: (!) 154/87 (!) 143/89  Pulse: 63 69  Resp: 18 20  Temp: 98.1 F (36.7 C) 98 F (36.7 C)   I/O last 3 completed shifts: In: [id number] [P.O.:240; I.V.:823] Out: 297.5 [Urine:200; Drains:97.5] Total I/O In: -  Out: 35 [Drains:35]   PE:  Gen: NAD Abd: soft, flat, non-distended, minimally tender, incisions c/d/i, JP drain serous  Ext: 2+ pulses, no edema  CBC Latest Ref Rng & Units 09/08/2016 09/07/2016 09/07/2016  WBC 3.8 - 10.6 K/uL 7.6 11.0(H) 11.6(H)  Hemoglobin 13.0 - 18.0 g/dL 10.8(L) 11.6(L) 11.7(L)  Hematocrit 40.0 - 52.0 % 30.0(L) 32.8(L) 33.3(L)  Platelets 150 - 440 K/uL 137(L) 150 141(L)   CMP Latest Ref Rng & Units 09/06/2016 09/05/2016 08/01/2016  Glucose 65 - 99 mg/dL 192(H) - 89  BUN 6 - 20 mg/dL 12 - 12  Creatinine 0.61 - 1.24 mg/dL 0.94 0.95 0.90  Sodium 135 - 145 mmol/L 136 - 140  Potassium 3.5 - 5.1 mmol/L 4.6 - 4.9  Chloride 101 - 111 mmol/L 104 - 104  CO2 22 - 32 mmol/L 27 - 31  Calcium 8.9 - 10.3 mg/dL 8.6(L) - 9.3  Total Protein 6.5 - 8.1 g/dL - - 7.6  Total Bilirubin 0.3 - 1.2 mg/dL - - 0.6  Alkaline Phos 38 - 126 U/L - - 49  AST 15 - 41 U/L - - 25  ALT 17 - 63 U/L - - 28   A/P:  45yr old male POD#3 from Laparoscopic sigmoid colectomy.  Pain: continue tylenol and prn oxycodone Regular diet Encourage ambulation  May d/c home when has BM

## 2016-09-08 NOTE — Discharge Summary (Signed)
Physician Discharge Summary  Patient ID: Connor Stone MRN: [id number] DOB/AGE: 1971/04/17 45 y.o.  Admit date: 09/05/2016 Discharge date: 09/08/2016  Admission Diagnoses: Sigmoid diverticulitis   Discharge Diagnoses:  Active Problems:   S/P laparoscopic colectomy   Discharged Condition: good  Hospital Course: 45 yr old with sigmoid diverticulitis underwent laparoscopic sigmoid colectomy on 11/8 with Dr. Dahlia Byes.  He did well post op.  He is tolerating a regular diet.  He is up and walking around and pain controlled with po meds.  He is having BMs as well.   Consults: None  Treatments: surgery: Lap sigmoid colectomy Dr. Dahlia Byes  Discharge Exam: Blood pressure (!) 143/89, pulse 69, temperature 98 F (36.7 C), temperature source Oral, resp. rate 20, height 6\' 1"  (1.854 m), weight 163 lb (73.9 kg), SpO2 100 %. General appearance: alert, cooperative and no distress GI: soft, appropriately tender, incisions c/d/i with sterile glue in place, JP drain serous Extremities: extremities normal, atraumatic, no cyanosis or edema  Disposition: 01-Home or Self Care  Discharge Instructions    Call MD for:  difficulty breathing, headache or visual disturbances    Complete by:  As directed    Call MD for:  persistant nausea and vomiting    Complete by:  As directed    Call MD for:  redness, tenderness, or signs of infection (pain, swelling, redness, odor or green/yellow discharge around incision site)    Complete by:  As directed    Call MD for:  severe uncontrolled pain    Complete by:  As directed    Call MD for:  temperature >100.4    Complete by:  As directed    Diet general    Complete by:  As directed    Driving Restrictions    Complete by:  As directed    No driving while on prescription pain medication   Increase activity slowly    Complete by:  As directed    Lifting restrictions    Complete by:  As directed    No lifting over 15lbs for 3 weeks   No dressing needed    Complete  by:  As directed        Medication List    STOP taking these medications   erythromycin base 500 MG tablet Commonly known as:  E-MYCIN   neomycin 500 MG tablet Commonly known as:  MYCIFRADIN     TAKE these medications   acetaminophen 325 MG tablet Commonly known as:  TYLENOL Take 650 mg by mouth every 6 (six) hours as needed.   dicyclomine 20 MG tablet Commonly known as:  BENTYL Take 20 mg by mouth 2 (two) times daily as needed (for cramping). Reported on 03/28/2016   ibuprofen 200 MG tablet Commonly known as:  ADVIL,MOTRIN Take 600 mg by mouth every 6 (six) hours as needed (for pain). What changed:  Another medication with the same name was added. Make sure you understand how and when to take each.   ibuprofen 800 MG tablet Commonly known as:  ADVIL,MOTRIN Take 1 tablet (800 mg total) by mouth every 8 (eight) hours as needed. What changed:  You were already taking a medication with the same name, and this prescription was added. Make sure you understand how and when to take each.   oxyCODONE-acetaminophen 5-325 MG tablet Commonly known as:  ROXICET Take 1-2 tablets by mouth every 4 (four) hours as needed for severe pain.        Signed: Hubbard Robinson 09/08/2016,  3:03 PM

## 2016-09-08 NOTE — Plan of Care (Signed)
Problem: Health Behavior/Discharge Planning: Goal: Ability to manage health-related needs will improve Outcome: Progressing Patient educated on need to eat diet high in fiber, indicated udnerstanding

## 2016-09-08 NOTE — Progress Notes (Signed)
Discharged patient home with wife in stable condition, IV's discontinued, JP drain discontinued, reviewed discharge instructions with patient and gave him his oxycodone prescription.

## 2016-09-18 ENCOUNTER — Ambulatory Visit (INDEPENDENT_AMBULATORY_CARE_PROVIDER_SITE_OTHER): Payer: BC Managed Care – PPO | Admitting: Surgery

## 2016-09-18 ENCOUNTER — Encounter: Payer: Self-pay | Admitting: Surgery

## 2016-09-18 VITALS — BP 135/88 | HR 73 | Temp 98.7°F | Wt 173.0 lb

## 2016-09-18 DIAGNOSIS — Z09 Encounter for follow-up examination after completed treatment for conditions other than malignant neoplasm: Secondary | ICD-10-CM

## 2016-09-18 NOTE — Patient Instructions (Addendum)
We will refer you to Neurology for your ongoing Left Lower Extremity Pain. We will have you follow-up with Dr. Dahlia Byes after you see Neurology.  Please call our office with any questions or concerns.  Please do not submerge in a tub, hot tub, or pool until incisions are completely sealed.  Use sun block to incision area over the next year if this area will be exposed to sun. This helps decrease scarring.  You may now resume your normal activities. Listen to your body when lifting, if you have pain when lifting, stop and then try again in a few days.  If you develop redness, drainage, or pain at incision sites- call our office immediately and speak with a nurse.

## 2016-09-18 NOTE — Progress Notes (Signed)
S/p LAR for diverticulitis Path no evidence of CA HE is taking PO, no N/V His complaints are bilateral forearm pain and left lower extremity pain when he walks No fevers or chills He walks into the room   PE NAD Abd: incision c/d/i, no infection, healing well. No peritonitis Ext: there is a left superficial thrombophlebitis on left hand. No cellulitis, non tender No evidence of hand drop or injury to plexus Left leg no obvious lesions, no obvious specific nerve injury  A/p Doing well after LAR, significant upper and lower extremity pain, ? Positional from the OR vs traction injury from lithotomy position. No specific nerve injury identified on my exam We will refer to neurology for further eval RTC 3 weeks No surgical complicaitons

## 2016-09-19 ENCOUNTER — Telehealth: Payer: Self-pay | Admitting: Surgery

## 2016-09-19 NOTE — Telephone Encounter (Signed)
Patient was referred to Neurology with [facility] for Peripheral neuropathy lower extremity post surgery.  Appointment has been made with Allena Napoleon, NP-Dr Melrose Nakayama. 09/28/16 @ 8:15am [employer].   Patient has been advised.

## 2016-10-10 ENCOUNTER — Ambulatory Visit (INDEPENDENT_AMBULATORY_CARE_PROVIDER_SITE_OTHER): Payer: BC Managed Care – PPO | Admitting: Surgery

## 2016-10-10 ENCOUNTER — Encounter: Payer: Self-pay | Admitting: Surgery

## 2016-10-10 VITALS — BP 136/85 | HR 67 | Temp 98.2°F | Ht 73.0 in | Wt 179.4 lb

## 2016-10-10 DIAGNOSIS — Z09 Encounter for follow-up examination after completed treatment for conditions other than malignant neoplasm: Secondary | ICD-10-CM

## 2016-10-10 MED ORDER — POLYETHYLENE GLYCOL POWD: 17.0000 g | Freq: Every day | 5 refills | Status: AC

## 2016-10-10 NOTE — Progress Notes (Signed)
S/p colectomy for diverticulitis 5 weeks ago Doing well Some ocasional abd spasms His LE and arm pain is gone Neurology f/u no further rx Taking po and having BM  PE NAD Abd: soft, nt, incisions healing well, no infection  A/P doing well RTC next month w/o restrictions No surgical issues RTC prn

## 2016-10-10 NOTE — Patient Instructions (Signed)
Please pick up your Miralax at the pharmacy and begin today. You may increase to 2 doses needed but begin with 1 dose daily. Our goal 2-3 bowel movements daily in order to decrease the "bloating" feeling. Please call our office after doing this for 2 weeks consistently, if you are not feeling any better.   Constipation, Adult Constipation is when a person has fewer bowel movements in a week than normal, has difficulty having a bowel movement, or has stools that are dry, hard, or larger than normal. Constipation may be caused by an underlying condition. It may become worse with age if a person takes certain medicines and does not take in enough fluids. Follow these instructions at home: Eating and drinking  Eat foods that have a lot of fiber, such as fresh fruits and vegetables, whole grains, and beans.  Limit foods that are high in fat, low in fiber, or overly processed, such as french fries, hamburgers, cookies, candies, and soda.  Drink enough fluid to keep your urine clear or pale yellow. General instructions  Exercise regularly or as told by your health care provider.  Go to the restroom when you have the urge to go. Do not hold it in.  Take over-the-counter and prescription medicines only as told by your health care provider. These include any fiber supplements.  Practice pelvic floor retraining exercises, such as deep breathing while relaxing the lower abdomen and pelvic floor relaxation during bowel movements.  Watch your condition for any changes.  Keep all follow-up visits as told by your health care provider. This is important. Contact a health care provider if:  You have pain that gets worse.  You have a fever.  You do not have a bowel movement after 4 days.  You vomit.  You are not hungry.  You lose weight.  You are bleeding from the anus.  You have thin, pencil-like stools. Get help right away if:  You have a fever and your symptoms suddenly get worse.  You  leak stool or have blood in your stool.  Your abdomen is bloated.  You have severe pain in your abdomen.  You feel dizzy or you faint. This information is not intended to replace advice given to you by your health care provider. Make sure you discuss any questions you have with your health care provider. Document Released: 07/13/2004 Document Revised: 05/04/2016 Document Reviewed: 04/04/2016 Elsevier Interactive Patient Education  2017 [employer].

## 2017-07-23 IMAGING — CT CT HEAD W/O CM
3 series · 18 of 30 positions shown, 20 images · non-contrast
Comparison: None.

CLINICAL DATA: Fell and struck back of head. Headache and chest
pain.

EXAM:
CT HEAD WITHOUT CONTRAST
TECHNIQUE: Contiguous axial images were obtained from the base of the skull
through the vertex without intravenous contrast.

[Series 2: head wo · axial · 0.44mm/px · z∈[-148,-33]mm · 6 of 33 slices shown, 8 images]
[im 5/33  brain]
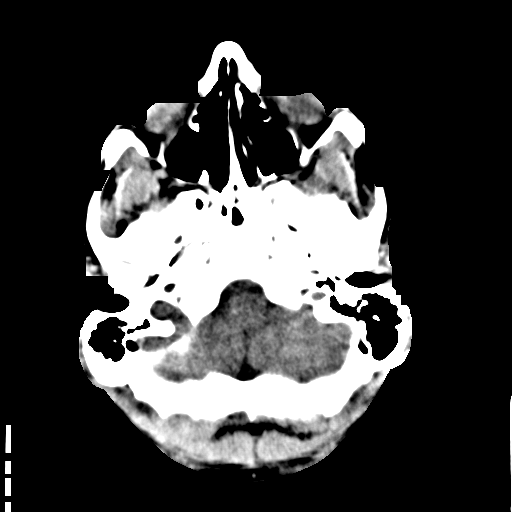
[im 5/33  bone]
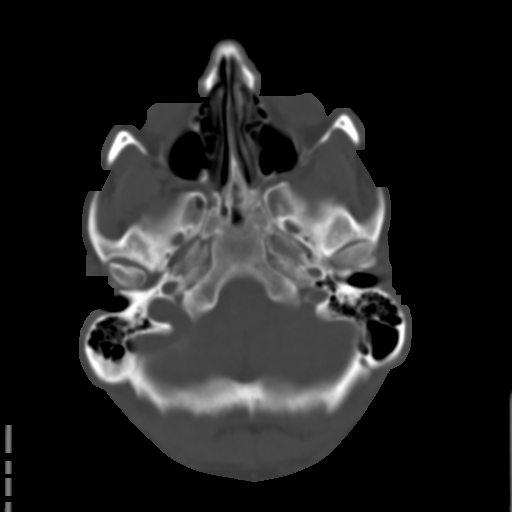
[im 10/33  brain]
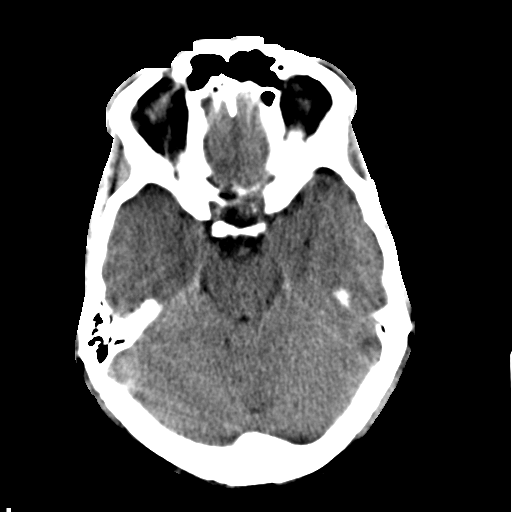
[im 14/33  brain]
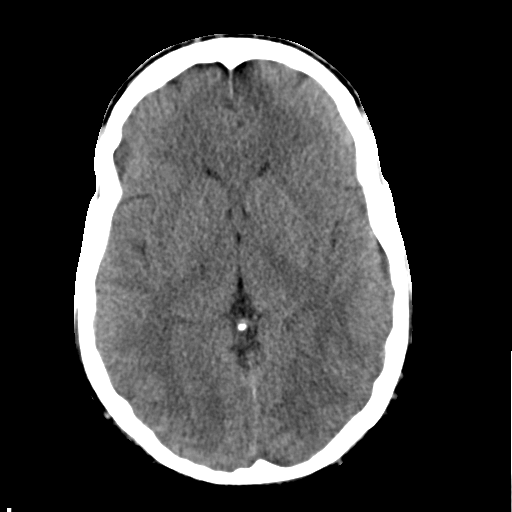
[im 19/33  brain]
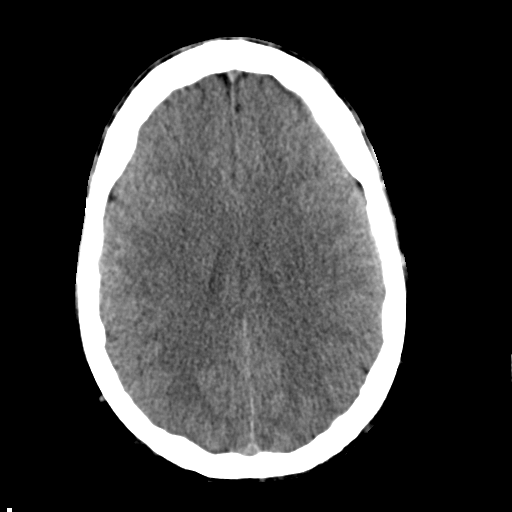
[im 23/33  brain]
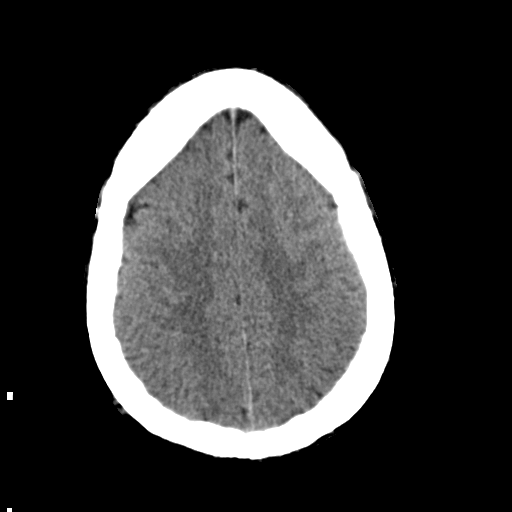
[im 23/33  bone]
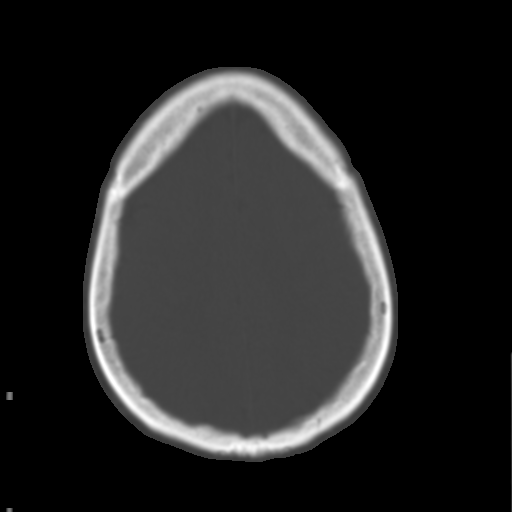
[im 28/33  brain]
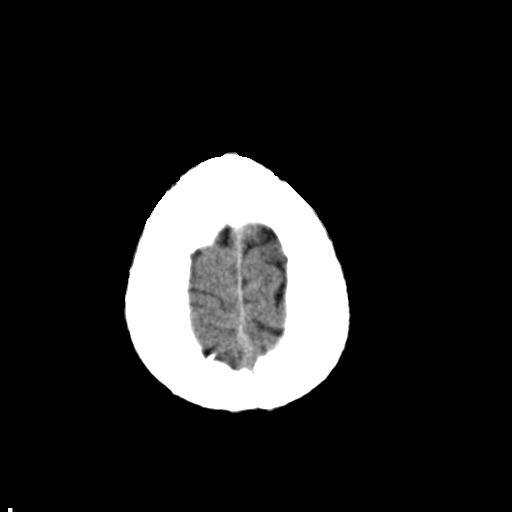

[Series 4: head wo recons · axial · 0.43mm/px · z∈[-92,-7]mm · 4 of 30 slices shown]
[im 6/30  brain]
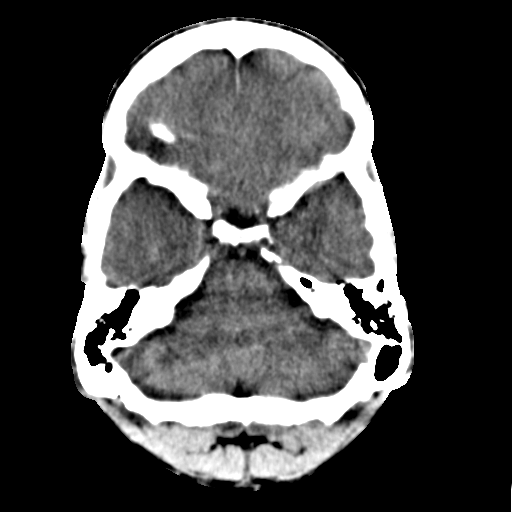
[im 12/30  brain]
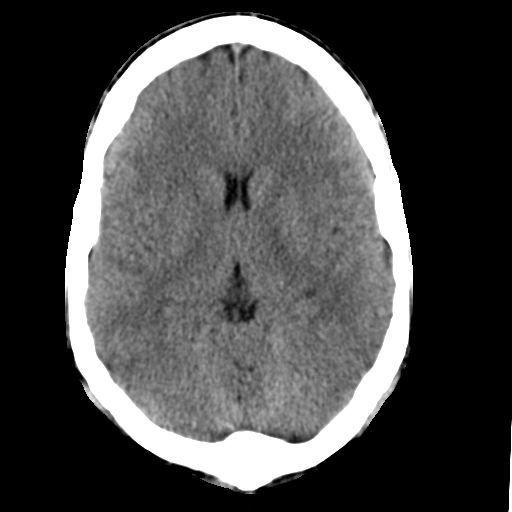
[im 18/30  brain]
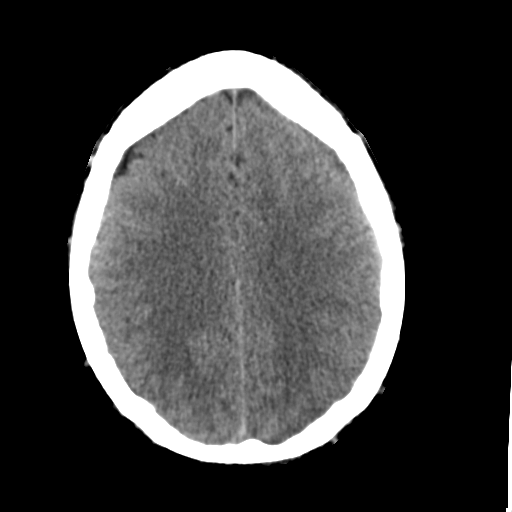
[im 24/30  brain]
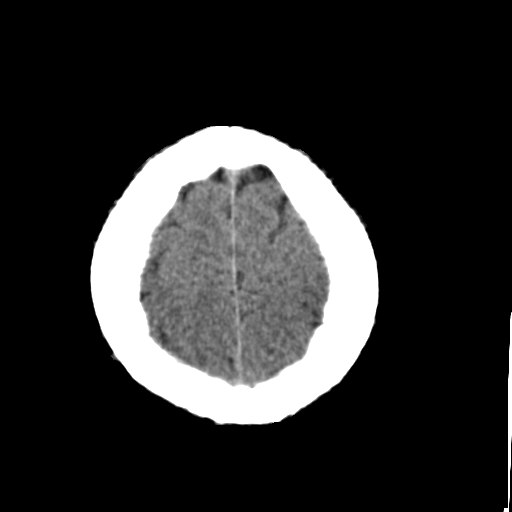

[Series 5: head bone recons · axial · 0.43mm/px · z∈[-106,+18]mm · 8 of 77 slices shown]
[im 6/77  bone]
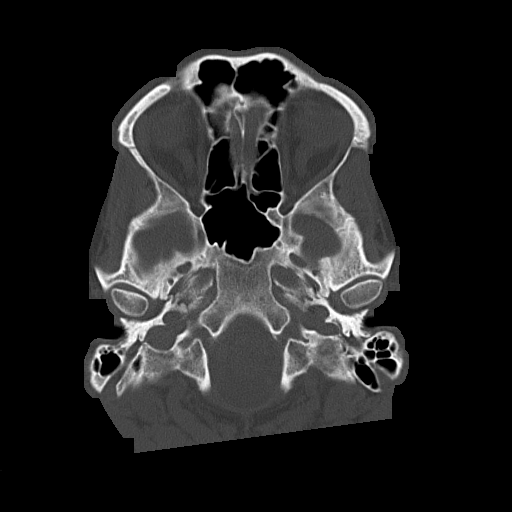
[im 16/77  bone]
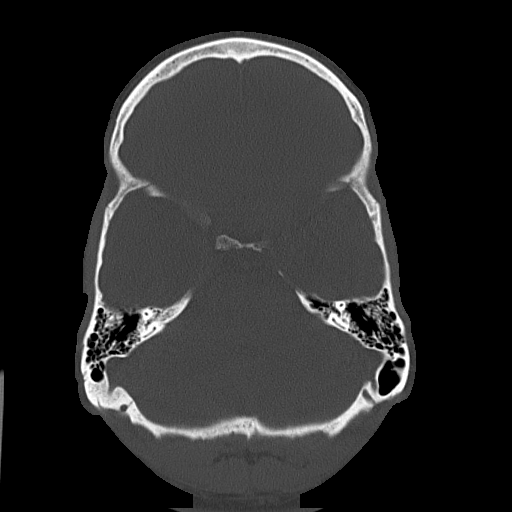
[im 26/77  bone]
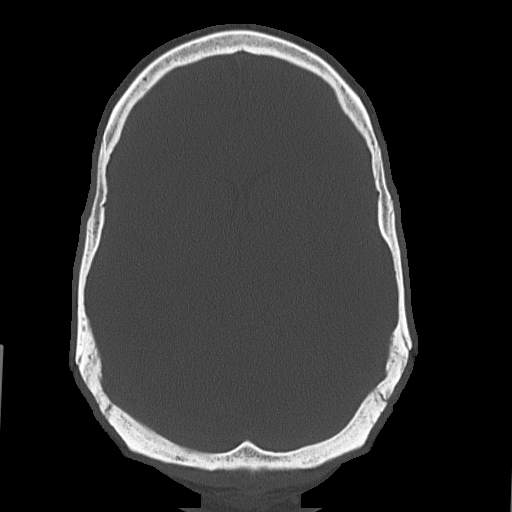
[im 36/77  bone]
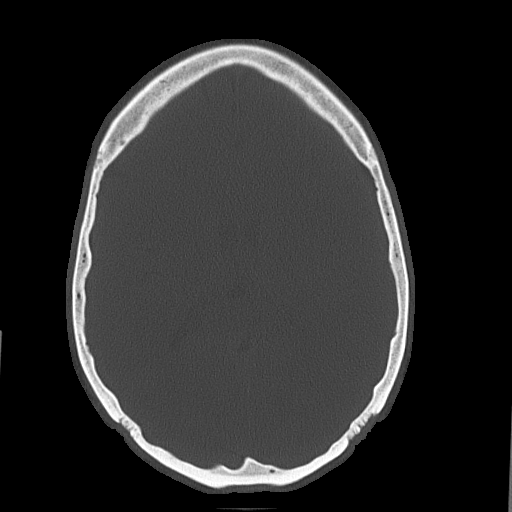
[im 41/77  bone]
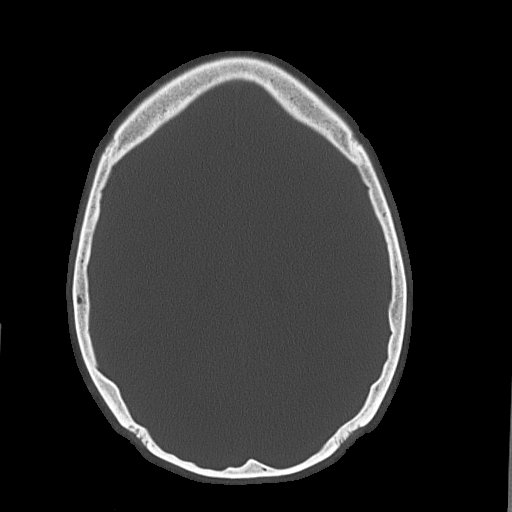
[im 51/77  bone]
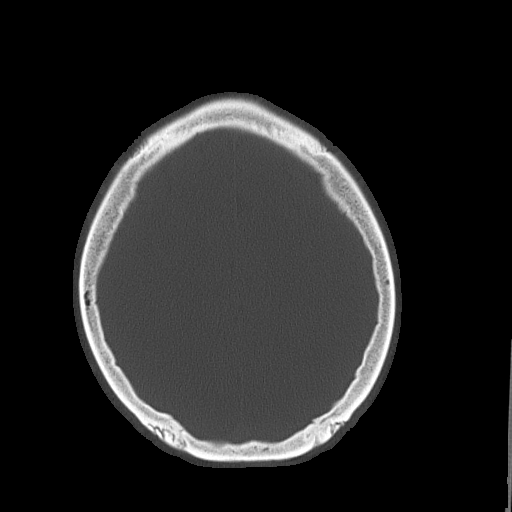
[im 61/77  bone]
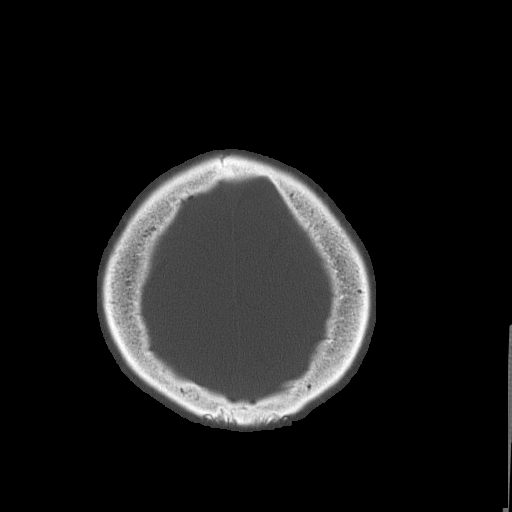
[im 71/77  bone]
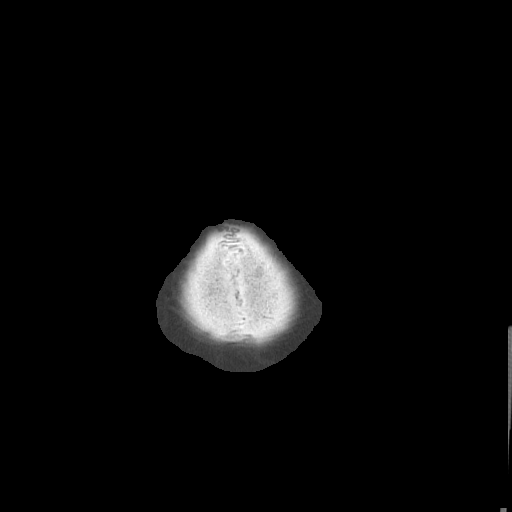

[18 of 30 positions shown; findings below may reference images not displayed]

FINDINGS: Ventricles and sulci appear symmetrical. No ventricular dilatation.
No mass effect or midline shift. No abnormal extra-axial fluid
collections. Gray-white matter junctions are distinct. Basal
cisterns are not effaced. No evidence of acute intracranial
hemorrhage. No depressed skull fractures. Visualized paranasal
sinuses and mastoid air cells are not opacified.
IMPRESSION: No acute intracranial abnormalities.

## 2017-07-23 IMAGING — DX DG CHEST 1V PORT
1 series · 1 of 1 positions shown · non-contrast
Comparison: None.

CLINICAL DATA: Chest pain after fall. Central/left-sided pain. Fall
into kitchen column.

EXAM:
PORTABLE CHEST 1 VIEW

[chest ap]
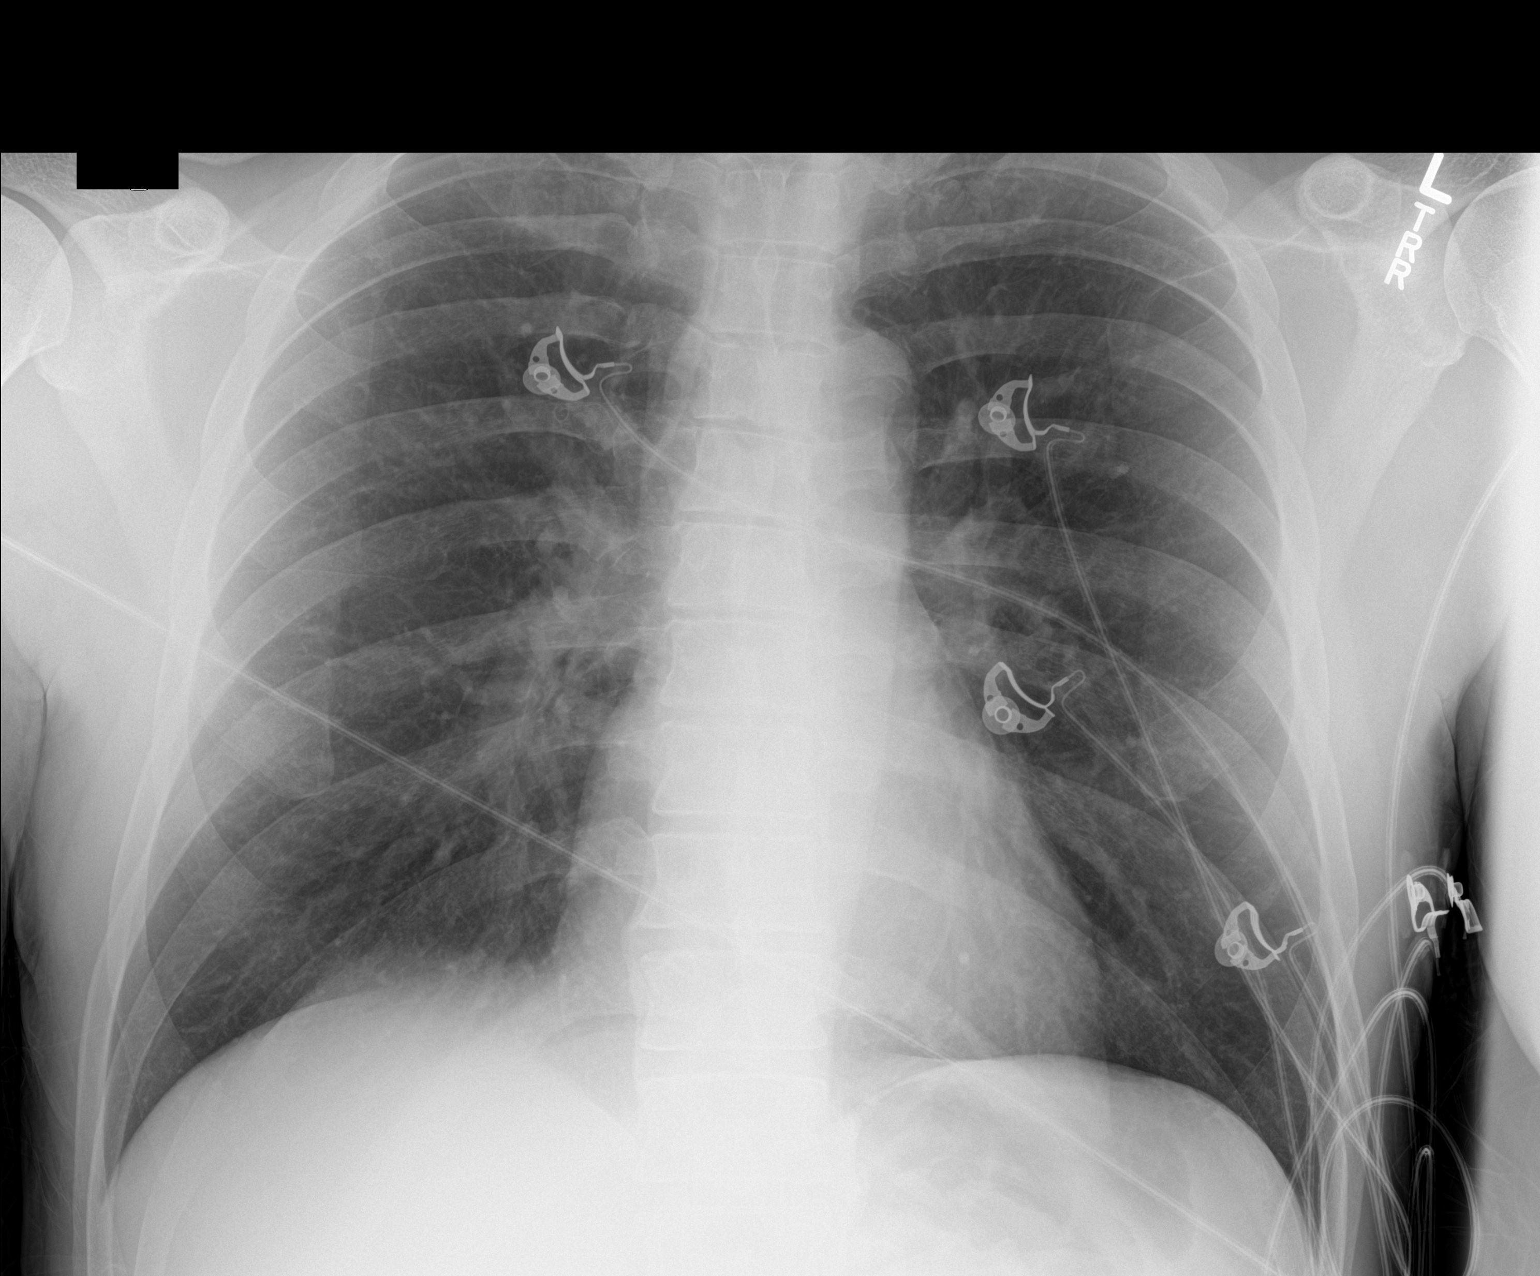

[1 of 1 positions shown; findings below may reference images not displayed]

FINDINGS: The cardiomediastinal contours are normal. The lungs are clear.
Pulmonary vasculature is normal. No consolidation, pleural effusion,
or pneumothorax. No acute osseous abnormalities are seen.
IMPRESSION: No acute process.

## 2018-05-05 ENCOUNTER — Encounter: Payer: Self-pay | Admitting: Gastroenterology

## 2018-05-05 ENCOUNTER — Ambulatory Visit: Payer: BC Managed Care – PPO | Admitting: Gastroenterology

## 2018-05-05 ENCOUNTER — Encounter (INDEPENDENT_AMBULATORY_CARE_PROVIDER_SITE_OTHER): Payer: Self-pay

## 2018-05-05 VITALS — BP 124/70 | HR 74 | Ht 73.0 in | Wt 182.0 lb

## 2018-05-05 DIAGNOSIS — D125 Benign neoplasm of sigmoid colon: Secondary | ICD-10-CM

## 2018-05-05 NOTE — Progress Notes (Signed)
    Primary Care Physician: Lucille Passy, MD  Primary Gastroenterologist:  Dr. Lucilla Lame  Chief Complaint  Patient presents with  . Hx of colon polyps    HPI: Connor Stone is a 47 y.o. male here to talk about the need for colonoscopy. The patient has had a colonoscopy in the past with a tubular adenoma found.  The patient subsequently had surgery for diverticulitis with abscess.  The patient has been doing well without any weight loss fevers chills black stools or bloody stools.  He also denies any abdominal pain.  The patient has not had any diarrhea associated with his colonic resection.  No current outpatient medications on file.   No current facility-administered medications for this visit.     Allergies as of 05/05/2018  . (No Known Allergies)    ROS:  General: Negative for anorexia, weight loss, fever, chills, fatigue, weakness. ENT: Negative for hoarseness, difficulty swallowing , nasal congestion. CV: Negative for chest pain, angina, palpitations, dyspnea on exertion, peripheral edema.  Respiratory: Negative for dyspnea at rest, dyspnea on exertion, cough, sputum, wheezing.  GI: See history of present illness. GU:  Negative for dysuria, hematuria, urinary incontinence, urinary frequency, nocturnal urination.  Endo: Negative for unusual weight change.    Physical Examination:   BP 124/70   Pulse 74   Ht 6\' 1"  (1.854 m)   Wt 182 lb (82.6 kg)   BMI 24.01 kg/m   General: Well-nourished, well-developed in no acute distress.  Eyes: No icterus. Conjunctivae pink. Mouth: Oropharyngeal mucosa moist and pink , no lesions erythema or exudate. Lungs: Clear to auscultation bilaterally. Non-labored. Heart: Regular rate and rhythm, no murmurs rubs or gallops.  Abdomen: Bowel sounds are normal, nontender, nondistended, no hepatosplenomegaly or masses, no abdominal bruits or hernia , no rebound or guarding.   Extremities: No lower extremity edema. No clubbing or  deformities. Neuro: Alert and oriented x 3.  Grossly intact. Skin: Warm and dry, no jaundice.   Psych: Alert and cooperative, normal mood and affect.  Labs:    Imaging Studies: No results found.  Assessment and Plan:   Connor Stone is a 47 y.o. y/o male Who has a history of a large adenoma in the sigmoid.  The patient adenoma was a villous adenoma.  The patient is now due for repeat colonoscopy.  The patient will be set up for a repeat colonoscopy. I have discussed risks & benefits which include, but are not limited to, bleeding, infection, perforation & drug reaction.  The patient agrees with this plan & written consent will be obtained.       Lucilla Lame, MD. Marval Regal   Note: This dictation was prepared with Dragon dictation along with smaller phrase technology. Any transcriptional errors that result from this process are unintentional.

## 2019-03-03 ENCOUNTER — Encounter: Payer: Self-pay | Admitting: Family Medicine

## 2019-03-03 ENCOUNTER — Ambulatory Visit (INDEPENDENT_AMBULATORY_CARE_PROVIDER_SITE_OTHER): Payer: BC Managed Care – PPO | Admitting: Family Medicine

## 2019-03-03 ENCOUNTER — Other Ambulatory Visit: Payer: BC Managed Care – PPO

## 2019-03-03 DIAGNOSIS — I10 Essential (primary) hypertension: Secondary | ICD-10-CM | POA: Diagnosis not present

## 2019-03-03 HISTORY — DX: Essential (primary) hypertension: I10

## 2019-03-03 LAB — CBC WITH DIFFERENTIAL/PLATELET
Basophils Absolute: 0.1 10*3/uL (ref 0.0–0.1)
Basophils Relative: 0.6 % (ref 0.0–3.0)
Eosinophils Absolute: 0 10*3/uL (ref 0.0–0.7)
Eosinophils Relative: 0.3 % (ref 0.0–5.0)
HCT: 44.2 % (ref 39.0–52.0)
Hemoglobin: 15.4 g/dL (ref 13.0–17.0)
Lymphocytes Relative: 19 % (ref 12.0–46.0)
Lymphs Abs: 1.7 10*3/uL (ref 0.7–4.0)
MCHC: 34.9 g/dL (ref 30.0–36.0)
MCV: 90.8 fl (ref 78.0–100.0)
Monocytes Absolute: 0.6 10*3/uL (ref 0.1–1.0)
Monocytes Relative: 6.7 % (ref 3.0–12.0)
Neutro Abs: 6.4 10*3/uL (ref 1.4–7.7)
Neutrophils Relative %: 73.4 % (ref 43.0–77.0)
Platelets: 211 10*3/uL (ref 150.0–400.0)
RBC: 4.87 Mil/uL (ref 4.22–5.81)
RDW: 13.1 % (ref 11.5–15.5)
WBC: 8.7 10*3/uL (ref 4.0–10.5)

## 2019-03-03 LAB — TSH: TSH: 3.22 u[IU]/mL (ref 0.35–4.50)

## 2019-03-03 LAB — T4, FREE: Free T4: 0.84 ng/dL (ref 0.60–1.60)

## 2019-03-03 LAB — COMPREHENSIVE METABOLIC PANEL
ALT: 20 U/L (ref 0–53)
AST: 15 U/L (ref 0–37)
Albumin: 4.6 g/dL (ref 3.5–5.2)
Alkaline Phosphatase: 47 U/L (ref 39–117)
BUN: 10 mg/dL (ref 6–23)
CO2: 28 mEq/L (ref 19–32)
Calcium: 9 mg/dL (ref 8.4–10.5)
Chloride: 100 mEq/L (ref 96–112)
Creatinine, Ser: 0.84 mg/dL (ref 0.40–1.50)
GFR: 97.78 mL/min (ref >60.00)
Glucose, Bld: 88 mg/dL (ref 70–99)
Potassium: 4.2 mEq/L (ref 3.5–5.1)
Sodium: 137 mEq/L (ref 135–145)
Total Bilirubin: 1.2 mg/dL (ref 0.2–1.2)
Total Protein: 6.8 g/dL (ref 6.0–8.3)

## 2019-03-03 LAB — FERRITIN: Ferritin: 86.5 ng/mL (ref 22.0–322.0)

## 2019-03-03 MED ORDER — HYDROCHLOROTHIAZIDE 12.5 MG PO CAPS: 12.5000 mg | ORAL_CAPSULE | Freq: Every day | ORAL | 1 refills | Status: DC

## 2019-03-03 NOTE — Assessment & Plan Note (Addendum)
>  40 minutes spent in face to face time with patient, >50% spent in counselling or coordination of care.  GAD7 and PHQ9 screening negative- does not seem anxiety driven although repeatedly checking his BP may be causing it to go up due to anxiety of what the reading may be.  His home BP cuff is accurate due to his daughter's health issues.  Discussed DASH diet, start low dose HCTZ 12.5 mg daily, will bring him in for labs today for further work up.  He will continue to update me and check his BP twice daily. The patient indicates understanding of these issues and agrees with the plan. Orders Placed This Encounter  Procedures  . TSH  . Comprehensive metabolic panel  . CBC with Differential/Platelet  . Ferritin  . T4, free

## 2019-03-03 NOTE — Progress Notes (Signed)
Virtual Visit via Video   Due to the COVID-19 pandemic, this visit was completed with telemedicine (audio/video) technology to reduce patient and provider exposure as well as to preserve personal protective equipment.   I connected with Connor Stone by a video enabled telemedicine application and verified that I am speaking with the correct person using two identifiers. Location patient: Home Location provider: [place], Office Persons participating in the virtual visit: Connor Hua, MD   I discussed the limitations of evaluation and management by telemedicine and the availability of in person appointments. The patient expressed understanding and agreed to proceed.  Care Team   Patient Care Team: Connor Passy, MD as PCP - General (Family Medicine)  Subjective:   HPI:   Elevated blood pressure- He does not have a history of high blood pressure and does not take any antihypertensives. This am his BP was 174/101. Started taking BP yesterday because he has been feeling like anxiety like a ball of butterflies in his stomach. Had a headache a few days last week that was at the crown of his head. Last night didn't feel right and BP was 159/100. Denies arm or neck pain or chest pain or SOB. Started exercising last week.  No increased salt or caffeine intake.  Feels his weight is fluctuating.  Lab Results  Component Value Date   CREATININE 0.94 09/06/2016   Lab Results  Component Value Date   WBC 7.6 09/08/2016   HGB 10.8 (L) 09/08/2016   HCT 30.0 (L) 09/08/2016   MCV 90.0 09/08/2016   PLT 137 (L) 09/08/2016   No results found for: TSH   BP Readings from Last 3 Encounters:  03/03/19 (!) 174/101  05/05/18 124/70  10/10/16 136/85   GAD 7 : Generalized Anxiety Score 03/03/2019  Nervous, Anxious, on Edge 1  Control/stop worrying 0  Worry too much - different things 1  Trouble relaxing 0  Restless 0  Easily annoyed or irritable 0  Afraid - awful might  happen 0  Total GAD 7 Score 2  Anxiety Difficulty Not difficult at all    Depression screen [facility] 2/9 03/03/2019  Decreased Interest 0  Down, Depressed, Hopeless 0  PHQ - 2 Score 0     Review of Systems  Constitutional: Negative for diaphoresis.  HENT: Negative.   Eyes: Negative.   Respiratory: Negative for shortness of breath.   Cardiovascular: Negative for chest pain, palpitations, orthopnea, claudication, leg swelling and PND.  Musculoskeletal: Negative.   Skin: Negative.   Neurological: Positive for headaches. Negative for dizziness, tingling, tremors, sensory change, speech change, focal weakness, seizures, loss of consciousness and weakness.  Psychiatric/Behavioral: Negative for depression, hallucinations, memory loss, substance abuse and suicidal ideas. The patient is nervous/anxious. The patient does not have insomnia.   All other systems reviewed and are negative.    Patient Active Problem List   Diagnosis Date Noted  . Elevated blood pressure reading with diagnosis of hypertension 03/03/2019  . S/P laparoscopic colectomy 09/05/2016  . Diverticulitis of large intestine with abscess without bleeding 03/05/2016  . Pericolonic abscess due to diverticulitis   . Change in bowel habits 05/24/2015  . 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 Blood in stool 05/24/2015  . Genetic testing 05/10/2015  . Family history of malignant neoplasm of breast 03/21/2015  . Family history of BRCA1 gene positive 03/21/2015  . Family history of prostate cancer 03/21/2015  . Family history of BRCA gene positive 02/15/2015  . Nephrolithiasis 02/15/2015  Social History   Tobacco Use  . Smoking status: Never Smoker  . Smokeless tobacco: Never Used  Substance Use Topics  . Alcohol use: Yes    Alcohol/week: 0.0 standard drinks    Comment: 2-4 beers / daily   No current outpatient medications on file.  No Known Allergies  Objective:  BP (!) 174/101   Pulse 75   Temp 97.8 F (36.6 C) (Oral)   Wt  177 lb (80.3 kg)   BMI 23.35 kg/m   Wt Readings from Last 3 Encounters:  03/03/19 177 lb (80.3 kg)  05/05/18 182 lb (82.6 kg)  10/10/16 179 lb 6.4 oz (81.4 kg)    VITALS: Per patient if applicable, see vitals. GENERAL: Alert, appears well and in no acute distress. HEENT: Atraumatic, conjunctiva clear, no obvious abnormalities on inspection of external nose and ears. NECK: Normal movements of the head and neck. CARDIOPULMONARY: No increased WOB. Speaking in clear sentences. I:E ratio WNL.  MS: Moves all visible extremities without noticeable abnormality. PSYCH: Pleasant and cooperative, well-groomed. Speech normal rate and rhythm. Affect is appropriate. Insight and judgement are appropriate. Attention is focused, linear, and appropriate.  NEURO: CN grossly intact. Oriented as arrived to appointment on time with no prompting. Moves both UE equally.  SKIN: No obvious lesions, wounds, erythema, or cyanosis noted on face or hands.  Depression screen PHQ 2/9 03/03/2019  Decreased Interest 0  Down, Depressed, Hopeless 0  PHQ - 2 Score 0    Assessment and Plan:   Connor Stone was seen today for hypertension.  Diagnoses and all orders for this visit:  Elevated blood pressure reading with diagnosis of hypertension    . COVID-19 Education: The signs and symptoms of COVID-19 were discussed with the patient and how to seek care for testing if needed. The importance of social distancing was discussed today. . Reviewed expectations re: course of current medical issues. . Discussed self-management of symptoms. . Outlined signs and symptoms indicating need for more acute intervention. . Patient verbalized understanding and all questions were answered. Marland Kitchen Health Maintenance issues including appropriate healthy diet, exercise, and smoking avoidance were discussed with patient. . See orders for this visit as documented in the electronic medical record.  Connor Norris, MD  Records requested if needed.  Time spent: 40 minutes, of which >50% was spent in obtaining information about his symptoms, reviewing his previous labs, evaluations, and treatments, counseling him about his condition (please see the discussed topics above), and developing a plan to further investigate it; he had a number of questions which I addressed.

## 2019-03-03 NOTE — Addendum Note (Signed)
Addended by: Lynnea Ferrier on: 03/03/2019 11:15 AM   Modules accepted: Orders

## 2019-03-05 ENCOUNTER — Ambulatory Visit: Payer: Self-pay

## 2019-03-05 ENCOUNTER — Telehealth: Payer: Self-pay | Admitting: Family Medicine

## 2019-03-05 NOTE — Telephone Encounter (Signed)
Made pt appointment to see Dr. Loletha Grayer 5/8. Advised pt if he had any symptoms of chest pain, diaphoresis, nausea to report to ED.

## 2019-03-05 NOTE — Telephone Encounter (Signed)
Spoke with pt. Made appointment for pt 5/8 8:30 with Dr. Domingo Madeira pt that Dr. Deborra Medina was out of the office until 5/11. Also advised pt that if he started to have symptoms such as SOB, chest pain, diaphoresis, nausea to report to the ED.

## 2019-03-05 NOTE — Telephone Encounter (Signed)
Patient called to give reading of BP readings. On 05/05 at 8pm his bp was 149/90.  On 05/06 at 8am his bp was 158-96 and at 6pm his bp was 156-101. On 05/07 at 7am his bp was 163/110 and at 10:50am his bp was 155/98. Please advise on this readings. I did tell patient that Dr. Deborra Medina is booked to be seen today but he is okay with seeing Dr. Bryan Lemma for a virtual visit if needed. Patient phone number is [phone]

## 2019-03-05 NOTE — Telephone Encounter (Signed)
  Pt called stating that he had had an appointment with Dr Deborra Medina 03/03/2019. His BP was elevated and he was Dx with hypertension.  He was place on HCTZ and states that he has been taking as prescribed. tody his BP is 163/110 waking up and now 155/98. He has taken his medication today. His only symptom is that he feels the hypertension in his chest. He states that it is like his heart beating out of his chest. Per protocol call was routed to office and transferred to [facility] for further evaluation. Reason for Disposition . [9] Systolic BP  >= 326 OR Diastolic >= 712  AND [4] having NO cardiac or neurologic symptoms  Answer Assessment - Initial Assessment Questions 1. BLOOD PRESSURE: "What is the blood pressure?" "Did you take at least two measurements 5 minutes apart?"     163/110  And 155/98 HR 85 2. ONSET: "When did you take your blood pressure?"     This AM 3. HOW: "How did you obtain the blood pressure?" (e.g., visiting nurse, automatic home BP monitor)     Home BP machine 4. HISTORY: "Do you have a history of high blood pressure?"     No DX last visit 5. MEDICATIONS: "Are you taking any medications for blood pressure?" "Have you missed any doses recently?"     No missed doses since starting 6. OTHER SYMPTOMS: "Do you have any symptoms?" (e.g., headache, chest pain, blurred vision, difficulty breathing, weakness)     Feels something in his chest heart beat out of chest 7. PREGNANCY: "Is there any chance you are pregnant?" "When was your last menstrual period?"     N/A  Protocols used: HIGH BLOOD PRESSURE-A-AH

## 2019-03-06 ENCOUNTER — Encounter: Payer: Self-pay | Admitting: Family Medicine

## 2019-03-06 ENCOUNTER — Ambulatory Visit (INDEPENDENT_AMBULATORY_CARE_PROVIDER_SITE_OTHER): Payer: BC Managed Care – PPO | Admitting: Family Medicine

## 2019-03-06 VITALS — BP 170/100 | HR 78 | Temp 97.2°F | Ht 73.0 in | Wt 177.0 lb

## 2019-03-06 DIAGNOSIS — I10 Essential (primary) hypertension: Secondary | ICD-10-CM | POA: Diagnosis not present

## 2019-03-06 MED ORDER — HYDROCHLOROTHIAZIDE 25 MG PO TABS: 25.0000 mg | ORAL_TABLET | Freq: Every day | ORAL | 3 refills | Status: DC

## 2019-03-06 NOTE — Progress Notes (Signed)
Virtual Visit via Video Note  I connected with Connor Stone on 03/06/19 at  8:30 AM EDT by a video enabled telemedicine application and verified that I am speaking with the correct person using two identifiers. Location patient: home Location provider: work  Persons participating in the virtual visit: patient, provider  I discussed the limitations of evaluation and management by telemedicine and the availability of in person appointments. The patient expressed understanding and agreed to proceed.  Chief Complaint  Patient presents with  . Hypertension    pt states he feels like he has pounding in his chest/ pt vitals last night 141/100 today 170/100    HPI: Connor Stone is a 48 y.o. male who is a patient of Dr. Deborra Medina. He was seen by his PCP 3 days ago on 5/5 for elevated BP. His BP readings at that time was 174/101 and on 5/4 is was 159/100. Pt noted a headache for a few days last week, but denied any other symptoms. He was started on HCTZ 12.72m on 5/5 and has been taking it daily. Pt reports BP of 141/100 last night (5/7) and 170/100 today. He describes a "pounding" in his chest like he "can feel [my] heart beating hard". He denies elevated or rapid heart rate. Denies chest pressure. He denies SOB but he states both he and his wife notice that some times he takes "deep breaths". He denies HA, vision changes, lightheadedness, dizziness, SOB, CP, n/v, diaphoresis, LE edema. Pt denies new or increased stress. He denies feeling anxious. GAD-7 = 2 and PHQ-2=0 on 5/5.  Pt started exercising last week and has been riding his bike in his neighborhood.  Pt notes PGF with HTN. Pt is not a smoker.   Past Medical History:  Diagnosis Date  . BRCA1 positive   . DVT (deep venous thrombosis) [place]   right leg  . Family history of adverse reaction to anesthesia   . History of kidney stones 2015  . Post-operative nausea and vomiting     Past Surgical History:  Procedure Laterality Date  . KNEE  ARTHROSCOPY W/ ACL RECONSTRUCTION Right   . LAPAROSCOPIC LYSIS OF ADHESIONS N/A 09/05/2016   Procedure: LAPAROSCOPIC LYSIS OF ADHESIONS;  Surgeon: DJules Husbands MD;  Location: [facility] ORS;  Service: General;  Laterality: N/A;  . LAPAROSCOPIC PARTIAL COLECTOMY N/A 09/05/2016   Procedure: LAPAROSCOPIC PARTIAL COLECTOMY Sigmoid colectomy, splenic flexure takedown Hand Assisted lap sigmoid colectomy;  Surgeon: DJules Husbands MD;  Location: [facility] ORS;  Service: General;  Laterality: N/A;  Modified lithotomy w Yellow fin stirrups    Family History  Problem Relation Age of Onset  . Breast cancer Mother 323      bilateral breast cancer; d. 367 no genetic testing  . Prostate cancer Father 778      no genetic testing and is an only child with a mother with cancer  . Diabetes Father   . Breast cancer Sister 482       BRCA1 positive, sister is DZarif Rathje(DOB 06/27/70) [place]. Result is positive for a pathogenic mutation called BRCA1, p.C61G.  . Cancer Paternal Grandmother 70       unknown type of rare cancer    Social History   Tobacco Use  . Smoking status: Never Smoker  . Smokeless tobacco: Never Used  Substance Use Topics  . Alcohol use: Yes    Alcohol/week: 0.0 standard drinks  Comment: 2-4 beers / daily  . Drug use: No     Current Outpatient Medications:  .  hydrochlorothiazide (MICROZIDE) 12.5 MG capsule, Take 1 capsule (12.5 mg total) by mouth daily., Disp: 30 capsule, Rfl: 1  No Known Allergies  ROS: See pertinent positives and negatives per HPI.   EXAM:  VITALS per patient if applicable: BP (!) 790/240 Comment: pt reported  Pulse 78 Comment: pt reported  Temp (!) 97.2 F (36.2 C) (Temporal)   Ht '6\' 1"'  (1.854 m)   Wt 177 lb (80.3 kg) Comment: pt reported  BMI 23.35 kg/m  BP Readings from Last 3 Encounters:  03/06/19 (!) 170/100  03/03/19 (!) 174/101  05/05/18 124/70     GENERAL: alert, oriented, appears well and in no  acute distress  NECK: normal movements of the head and neck  LUNGS: on inspection no signs of respiratory distress, breathing rate appears normal, no obvious gross SOB, gasping or wheezing, no conversational dyspnea  CV: no obvious cyanosis  MS: moves all visible extremities without noticeable abnormality  PSYCH/NEURO: pleasant and cooperative, speech and thought processing grossly intact   ASSESSMENT AND PLAN:  1. Elevated blood pressure reading with diagnosis of hypertension - not at goal per pts home BP log - pt has been on HCTZ 12.9m x 3 days (has not taken med yet today), no reported side effects - plan to increase HCTZ to 223mdaily (new Rx sent to pharm on file) and asked pt to check BP daily at least 1hr after taking med in AM and keep log. He has been checking his BP every 6hrs and I told him this is not necessary and once per day is adequate. - cont with regular CV exercise, low sodium diet - f/u with PCP or myself in 1-2 weeks or sooner PRN   I discussed the assessment and treatment plan with the patient. The patient was provided an opportunity to ask questions and all were answered. The patient agreed with the plan and demonstrated an understanding of the instructions.   The patient was advised to call back or seek an in-person evaluation if the symptoms worsen or if the condition fails to improve as anticipated.   MaLetta MedianDO

## 2019-03-23 NOTE — Progress Notes (Signed)
Virtual Visit via Video   Due to the COVID-19 pandemic, this visit was completed with telemedicine (audio/video) technology to reduce patient and provider exposure as well as to preserve personal protective equipment.   I connected with [name] by a video enabled telemedicine application and verified that I am speaking with the correct person using two identifiers. Location patient: Home Location provider: [facility], Office Persons participating in the virtual visit: Julaine Hua, MD   I discussed the limitations of evaluation and management by telemedicine and the availability of in person appointments. The patient expressed understanding and agreed to proceed.  Care Team   Patient Care Team: Lucille Passy, MD as PCP - General (Family Medicine)  Subjective:   HPI:  Mr. Connor Stone is a very pleasant gentleman who has been seen twice via televisit prior to today's visit for this complaint. Chart reviewed in detail.  Initially was seen by me on 03/03/19.  He has no personal history HTN but since the day prior, he had been checking his BP because he had a headache and felt like he had "a ball of butterflies in his stomach," and "just didn't feel right."  That night, BP was 159/100 and the morning I saw him virtually on the 5th of  May it was 174/101.  He trusts his home BP cuff because of his daughter Bella's medical issues, they have to check her BP regularly. He denied arm or neck pain or chest pain or SOB. Started exercising the week prior.  He reported no increased salt or caffeine intake. But he felt as if his weight his weight is fluctuating/ retaining fluid.  No LE edema.  Interestingly, his GAD7 and PHQ scores showed no indication of anxiety or depression. There it did not seem like his elevated BP was anxiety driven although repeatedly checking his BP may be causing it to go up due to anxiety of what the reading may be.   Discussed DASH diet, start low dose HCTZ 12.5  mg daily, and I brought him in for lab work that day which was reassuring. I advised him to continue to update me and check his BP twice daily.  He is also followed by Dr. 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  He then had another virtual visit with my excellent partner, Dr.Cirigliano on 03/06/19 for the same issue.  At that time he only been taking HCTZ 12.5 mg for days. On 5/8 he reported the following to her:  "Pt reports BP of 141/100 last night (5/7) and 170/100 today. He describes a "pounding" in his chest like he "can feel [my] heart beating hard". He denies elevated or rapid heart rate. Denies chest pressure. He denies SOB but he states both he and his wife notice that some times he takes "deep breaths". He denies HA, vision changes, lightheadedness, dizziness, SOB, CP, n/v, diaphoresis, LE edema."  She therefore increased his HCTZ to 25 mg daily and advised to check BP daily at least 1 hr after taking HCTZ in the morning and to keep a log. Advised to continue with regular CV exercise, low sodium diet.            Recent Results (from the past 2160 hour(s))  T4, free     Status: None   Collection Time: 03/03/19 11:15 AM  Result Value Ref Range   Free  T4 0.84 0.60 - 1.60 ng/dL    Comment: Specimens from patients who are undergoing biotin therapy and /or ingesting biotin supplements may contain high levels of biotin.  The higher biotin concentration in these specimens interferes with this Free T4 assay.  Specimens that contain high levels  of biotin may cause false high results for this Free T4 assay.  Please interpret results in light of the total clinical presentation of the patient.    Ferritin     Status: None   Collection Time: 03/03/19 11:15 AM  Result Value Ref Range   Ferritin 86.5 22.0 - 322.0 ng/mL  CBC with Differential/Platelet     Status: None    Collection Time: 03/03/19 11:15 AM  Result Value Ref Range   WBC 8.7 4.0 - 10.5 K/uL   RBC 4.87 4.22 - 5.81 Mil/uL   Hemoglobin 15.4 13.0 - 17.0 g/dL   HCT 44.2 39.0 - 52.0 %   MCV 90.8 78.0 - 100.0 fl   MCHC 34.9 30.0 - 36.0 g/dL   RDW 13.1 11.5 - 15.5 %   Platelets 211.0 150.0 - 400.0 K/uL   Neutrophils Relative % 73.4 43.0 - 77.0 %   Lymphocytes Relative 19.0 12.0 - 46.0 %   Monocytes Relative 6.7 3.0 - 12.0 %   Eosinophils Relative 0.3 0.0 - 5.0 %   Basophils Relative 0.6 0.0 - 3.0 %   Neutro Abs 6.4 1.4 - 7.7 K/uL   Lymphs Abs 1.7 0.7 - 4.0 K/uL   Monocytes Absolute 0.6 0.1 - 1.0 K/uL   Eosinophils Absolute 0.0 0.0 - 0.7 K/uL   Basophils Absolute 0.1 0.0 - 0.1 K/uL  Comprehensive metabolic panel     Status: None   Collection Time: 03/03/19 11:15 AM  Result Value Ref Range   Sodium 137 135 - 145 mEq/L   Potassium 4.2 3.5 - 5.1 mEq/L   Chloride 100 96 - 112 mEq/L   CO2 28 19 - 32 mEq/L   Glucose, Bld 88 70 - 99 mg/dL   BUN 10 6 - 23 mg/dL   Creatinine, Ser 0.84 0.40 - 1.50 mg/dL   Total Bilirubin 1.2 0.2 - 1.2 mg/dL   Alkaline Phosphatase 47 39 - 117 U/L   AST 15 0 - 37 U/L   ALT 20 0 - 53 U/L   Total Protein 6.8 6.0 - 8.3 g/dL   Albumin 4.6 3.5 - 5.2 g/dL   Calcium 9.0 8.4 - 10.5 mg/dL   GFR 97.78 >60.00 mL/min  TSH     Status: None   Collection Time: 03/03/19 11:15 AM  Result Value Ref Range   TSH 3.22 0.35 - 4.50 uIU/mL    GAD 7 : Generalized Anxiety Score 03/03/2019  Nervous, Anxious, on Edge 1  Control/stop worrying 0  Worry too much - different things 1  Trouble relaxing 0  Restless 0  Easily annoyed or irritable 0  Afraid - awful might happen 0  Total GAD 7 Score 2  Anxiety Difficulty Not difficult at all    Depression screen [facility] 2/9 03/03/2019  Decreased Interest 0  Down, Depressed, Hopeless 0  PHQ - 2 Score 0    He then had another virtual visit with my excellent partner, Dr.Cirigliano on 03/06/19 for the same issue.  At that time he only been  taking HCTZ 12.5 mg for days. On 5/8 he reported the following to her:  "Pt reports BP of 141/100 last night (5/7) and 170/100 today. He describes a "pounding" in  his chest like he "can feel [my] heart beating hard". He denies elevated or rapid heart rate. Denies chest pressure. He denies SOB but he states both he and his wife notice that some times he takes "deep breaths". He denies HA, vision changes, lightheadedness, dizziness, SOB, CP, n/v, diaphoresis, LE edema."  She therefore increased his HCTZ to 25 mg daily and advised to check BP daily at least 1 hr after taking HCTZ in the morning and to keep a log. Advised to continue with regular CV exercise, low sodium diet.  Today, he reports- BP,Wed 143/91; Thurs 140/88; Fri 154/91,Sun 164/97; Mon 131/80 P:92.   Edema has improved.  Cutting back on salt.  He talked with his brother who is a physician and was told they do have a family history of HTN in 27s and 79s.  No dizziness when he stands from a seated position.  Review of Systems  Constitutional: Negative.   HENT: Negative.   Eyes: Negative.   Respiratory: Negative.   Cardiovascular: Negative.   Gastrointestinal: Negative.   Musculoskeletal: Negative.   Skin: Negative.   Neurological: Negative.   Endo/Heme/Allergies: Negative.   Psychiatric/Behavioral: Negative.   All other systems reviewed and are negative.    Patient Active Problem List   Diagnosis Date Noted  . Elevated blood pressure reading with diagnosis of hypertension 03/03/2019  . S/P laparoscopic colectomy 09/05/2016  . Diverticulitis of large intestine with abscess without bleeding 03/05/2016  . Pericolonic abscess due to diverticulitis   . Change in bowel habits 05/24/2015  . 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 Blood in stool 05/24/2015  . Genetic testing 05/10/2015  . Family history of malignant neoplasm of breast 03/21/2015  . Family history of BRCA1 gene positive 03/21/2015  . Family history of prostate cancer  03/21/2015  . Family history of BRCA gene positive 02/15/2015  . Nephrolithiasis 02/15/2015    Social History   Tobacco Use  . Smoking status: Never Smoker  . Smokeless tobacco: Never Used  Substance Use Topics  . Alcohol use: Yes    Alcohol/week: 0.0 standard drinks    Comment: 2-4 beers / daily    Current Outpatient Medications:  .  hydrochlorothiazide (HYDRODIURIL) 25 MG tablet, Take 1 tablet (25 mg total) by mouth daily., Disp: 90 tablet, Rfl: 3  No Known Allergies  Objective:  BP (!) 144/92   Pulse 70   Temp (!) 96.9 F (36.1 C) (Oral)   VITALS: Per patient if applicable, see vitals. GENERAL: Alert, appears well and in no acute distress. HEENT: Atraumatic, conjunctiva clear, no obvious abnormalities on inspection of external nose and ears. NECK: Normal movements of the head and neck. CARDIOPULMONARY: No increased WOB. Speaking in clear sentences. I:E ratio WNL.  MS: Moves all visible extremities without noticeable abnormality. PSYCH: Pleasant and cooperative, well-groomed. Speech normal rate and rhythm. Affect is appropriate. Insight and judgement are appropriate. Attention is focused, linear, and appropriate.  NEURO: CN grossly intact. Oriented as arrived to appointment on time with no prompting. Moves both UE equally.  SKIN: No obvious lesions, wounds, erythema, or cyanosis noted on face or hands.  Depression screen PHQ 2/9 03/03/2019  Decreased Interest 0  Down, Depressed, Hopeless 0  PHQ - 2 Score 0    Assessment and Plan:   Allen was seen today for hospitalization follow-up.  Diagnoses and all orders for this visit:  Elevated blood pressure reading with diagnosis of hypertension    . COVID-19 Education: The signs and symptoms of COVID-19 were discussed  with the patient and how to seek care for testing if needed. The importance of social distancing was discussed today. . Reviewed expectations re: course of current medical issues. . Discussed  self-management of symptoms. . Outlined signs and symptoms indicating need for more acute intervention. . Patient verbalized understanding and all questions were answered. Marland Kitchen Health Maintenance issues including appropriate healthy diet, exercise, and smoking avoidance were discussed with patient. . See orders for this visit as documented in the electronic medical record.  Arnette Norris, MD  Records requested if needed. Time spent: 40 minutes, of which >50% was spent in obtaining information about his symptoms, reviewing his previous labs, evaluations, and treatments, counseling him about his condition (please see the discussed topics above), and developing a plan to further investigate it; he had a number of questions which I addressed.

## 2019-03-24 ENCOUNTER — Other Ambulatory Visit: Payer: BC Managed Care – PPO

## 2019-03-24 ENCOUNTER — Encounter: Payer: Self-pay | Admitting: Family Medicine

## 2019-03-24 ENCOUNTER — Telehealth (INDEPENDENT_AMBULATORY_CARE_PROVIDER_SITE_OTHER): Payer: BC Managed Care – PPO | Admitting: Family Medicine

## 2019-03-24 VITALS — BP 144/92 | HR 70 | Temp 96.9°F

## 2019-03-24 DIAGNOSIS — I10 Essential (primary) hypertension: Secondary | ICD-10-CM

## 2019-03-24 MED ORDER — HYDROCHLOROTHIAZIDE 25 MG PO TABS: 25.0000 mg | ORAL_TABLET | Freq: Every day | ORAL | 3 refills | Status: DC

## 2019-03-24 NOTE — Addendum Note (Signed)
Addended by: Lynnea Ferrier on: 03/24/2019 11:29 AM   Modules accepted: Orders

## 2019-03-24 NOTE — Addendum Note (Signed)
Addended by: Lynnea Ferrier on: 03/24/2019 12:10 PM   Modules accepted: Orders

## 2019-03-24 NOTE — Assessment & Plan Note (Signed)
>  40 minutes spent in face to face time with patient, >50% spent in counselling or coordination of care reviewing chart, labs, family history, treatment and work up with pt. Well controlled on higher dose of HCTZ, advised to continue DASH diet and to keep a log of BPs that he will send to me.  Since he is on higher dose of HCTZ, I do want him to come in for repeat CMET- monitor kidney function and electrolytes.  Orders entered.  We also did discuss Renal artery [place] and he wants to hold off on this which is reasonable.  Orders Placed This Encounter  Procedures  . Comprehensive metabolic panel

## 2019-03-25 LAB — COMPREHENSIVE METABOLIC PANEL
AG Ratio: 1.9 (calc) (ref 1.0–2.5)
ALT: 24 U/L (ref 9–46)
AST: 18 U/L (ref 10–40)
Albumin: 4.4 g/dL (ref 3.6–5.1)
Alkaline phosphatase (APISO): 39 U/L (ref 36–130)
BUN: 9 mg/dL (ref 7–25)
CO2: 34 mmol/L — ABNORMAL HIGH (ref 20–32)
Calcium: 9.4 mg/dL (ref 8.6–10.3)
Chloride: 98 mmol/L (ref 98–110)
Creat: 0.88 mg/dL (ref 0.60–1.35)
Globulin: 2.3 g/dL (calc) (ref 1.9–3.7)
Glucose, Bld: 99 mg/dL (ref 65–99)
Potassium: 3.6 mmol/L (ref 3.5–5.3)
Sodium: 139 mmol/L (ref 135–146)
Total Bilirubin: 0.6 mg/dL (ref 0.2–1.2)
Total Protein: 6.7 g/dL (ref 6.1–8.1)

## 2019-10-06 ENCOUNTER — Other Ambulatory Visit: Payer: Self-pay | Admitting: Orthopedic Surgery

## 2019-11-04 ENCOUNTER — Ambulatory Visit: Admit: 2019-11-04 | Payer: BC Managed Care – PPO | Admitting: Orthopedic Surgery

## 2019-11-04 SURGERY — LUMBAR LAMINECTOMY/DECOMPRESSION MICRODISCECTOMY
Anesthesia: General

## 2020-05-23 ENCOUNTER — Encounter: Payer: Self-pay | Admitting: Emergency Medicine

## 2020-05-23 ENCOUNTER — Emergency Department
Admission: EM | Admit: 2020-05-23 | Discharge: 2020-05-23 | Disposition: A | Discharge status: Home / Self Care | Payer: BC Managed Care – PPO | Attending: Emergency Medicine | Admitting: Emergency Medicine

## 2020-05-23 DIAGNOSIS — Z5321 Procedure and treatment not carried out due to patient leaving prior to being seen by health care provider: Secondary | ICD-10-CM | POA: Insufficient documentation

## 2020-05-23 DIAGNOSIS — Y92511 Restaurant or cafe as the place of occurrence of the external cause: Secondary | ICD-10-CM | POA: Diagnosis not present

## 2020-05-23 DIAGNOSIS — W133XXA Fall through floor, initial encounter: Secondary | ICD-10-CM | POA: Insufficient documentation

## 2020-05-23 DIAGNOSIS — Y999 Unspecified external cause status: Secondary | ICD-10-CM | POA: Diagnosis not present

## 2020-05-23 DIAGNOSIS — Y939 Activity, unspecified: Secondary | ICD-10-CM | POA: Insufficient documentation

## 2020-05-23 DIAGNOSIS — S0031XA Abrasion of nose, initial encounter: Secondary | ICD-10-CM | POA: Insufficient documentation

## 2020-05-23 DIAGNOSIS — R55 Syncope and collapse: Secondary | ICD-10-CM | POA: Insufficient documentation

## 2020-05-23 DIAGNOSIS — S00212A Abrasion of left eyelid and periocular area, initial encounter: Secondary | ICD-10-CM | POA: Insufficient documentation

## 2020-05-23 LAB — CBC
HCT: 42.3 % (ref 39.0–52.0)
Hemoglobin: 15 g/dL (ref 13.0–17.0)
MCH: 31.4 pg (ref 26.0–34.0)
MCHC: 35.5 g/dL (ref 30.0–36.0)
MCV: 88.7 fL (ref 80.0–100.0)
Platelets: 214 10*3/uL (ref 150–400)
RBC: 4.77 MIL/uL (ref 4.22–5.81)
RDW: 12.4 % (ref 11.5–15.5)
WBC: 8.1 10*3/uL (ref 4.0–10.5)
nRBC: 0 % (ref 0.0–0.2)

## 2020-05-23 LAB — BASIC METABOLIC PANEL
Anion gap: 8 (ref 5–15)
BUN: 8 mg/dL (ref 6–20)
CO2: 29 mmol/L (ref 22–32)
Calcium: 8.4 mg/dL — ABNORMAL LOW (ref 8.9–10.3)
Chloride: 101 mmol/L (ref 98–111)
Creatinine, Ser: 0.96 mg/dL (ref 0.61–1.24)
GFR calc Af Amer: >60 mL/min (ref >60)
GFR calc non Af Amer: >60 mL/min (ref >60)
Glucose, Bld: 156 mg/dL — ABNORMAL HIGH (ref 70–99)
Potassium: 2.9 mmol/L — ABNORMAL LOW (ref 3.5–5.1)
Sodium: 138 mmol/L (ref 135–145)

## 2020-05-23 LAB — TROPONIN I (HIGH SENSITIVITY): Troponin I (High Sensitivity): <2 ng/L (ref <18)

## 2020-05-23 NOTE — ED Triage Notes (Signed)
Pt arrived via EMS from restaurant where he had a syncopal episode, landing on floor. Pt has bruising and abrasions to the bridge of nose and above the left eye. Pt has had syncopal episodes in the past. Pt is A&O x4 in triage.

## 2020-05-23 NOTE — ED Notes (Signed)
Pt refuses troponin redraw at this time. Pt unwilling to stay and be seen. IV taken out at this time and pt provided with education on importance of seeking medical care.

## 2020-05-23 NOTE — ED Notes (Signed)
Pt refusing all CT scans

## 2020-05-24 ENCOUNTER — Telehealth: Payer: Self-pay | Admitting: Emergency Medicine

## 2020-05-24 NOTE — Telephone Encounter (Signed)
Called patient due to lwot to inquire about condition and follow up plans. Left message.   

## 2020-06-13 ENCOUNTER — Other Ambulatory Visit: Payer: Self-pay

## 2020-06-14 ENCOUNTER — Ambulatory Visit: Payer: BC Managed Care – PPO | Admitting: Family Medicine

## 2020-06-14 ENCOUNTER — Encounter: Payer: Self-pay | Admitting: Family Medicine

## 2020-06-14 VITALS — BP 132/80 | HR 73 | Temp 97.3°F | Ht 73.0 in | Wt 180.4 lb

## 2020-06-14 DIAGNOSIS — E876 Hypokalemia: Secondary | ICD-10-CM

## 2020-06-14 DIAGNOSIS — Z789 Other specified health status: Secondary | ICD-10-CM | POA: Insufficient documentation

## 2020-06-14 DIAGNOSIS — Z0000 Encounter for general adult medical examination without abnormal findings: Secondary | ICD-10-CM

## 2020-06-14 DIAGNOSIS — I10 Essential (primary) hypertension: Secondary | ICD-10-CM

## 2020-06-14 DIAGNOSIS — Z7289 Other problems related to lifestyle: Secondary | ICD-10-CM | POA: Insufficient documentation

## 2020-06-14 LAB — COMPREHENSIVE METABOLIC PANEL
ALT: 19 U/L (ref 0–53)
AST: 15 U/L (ref 0–37)
Albumin: 4.5 g/dL (ref 3.5–5.2)
Alkaline Phosphatase: 44 U/L (ref 39–117)
BUN: 12 mg/dL (ref 6–23)
CO2: 31 mEq/L (ref 19–32)
Calcium: 9.2 mg/dL (ref 8.4–10.5)
Chloride: 99 mEq/L (ref 96–112)
Creatinine, Ser: 0.83 mg/dL (ref 0.40–1.50)
GFR: 98.6 mL/min (ref >60.00)
Glucose, Bld: 86 mg/dL (ref 70–99)
Potassium: 3.6 mEq/L (ref 3.5–5.1)
Sodium: 137 mEq/L (ref 135–145)
Total Bilirubin: 0.9 mg/dL (ref 0.2–1.2)
Total Protein: 6.7 g/dL (ref 6.0–8.3)

## 2020-06-14 LAB — URINALYSIS, ROUTINE W REFLEX MICROSCOPIC
Bilirubin Urine: NEGATIVE
Hgb urine dipstick: NEGATIVE
Ketones, ur: NEGATIVE
Leukocytes,Ua: NEGATIVE
Nitrite: NEGATIVE
RBC / HPF: NONE SEEN (ref 0–?)
Specific Gravity, Urine: 1.02 (ref 1.000–1.030)
Total Protein, Urine: NEGATIVE
Urine Glucose: NEGATIVE
Urobilinogen, UA: 0.2 (ref 0.0–1.0)
pH: 7 (ref 5.0–8.0)

## 2020-06-14 LAB — MICROALBUMIN / CREATININE URINE RATIO
Creatinine,U: 91.4 mg/dL
Microalb Creat Ratio: 0.8 mg/g (ref 0.0–30.0)
Microalb, Ur: <0.7 mg/dL (ref 0.0–1.9)

## 2020-06-14 LAB — LIPID PANEL
Cholesterol: 167 mg/dL (ref 0–200)
HDL: 46.7 mg/dL (ref >39.00)
LDL Cholesterol: 102 mg/dL — ABNORMAL HIGH (ref 0–99)
NonHDL: 120.37
Total CHOL/HDL Ratio: 4
Triglycerides: 91 mg/dL (ref 0.0–149.0)
VLDL: 18.2 mg/dL (ref 0.0–40.0)

## 2020-06-14 LAB — CBC
HCT: 43.6 % (ref 39.0–52.0)
Hemoglobin: 15.6 g/dL (ref 13.0–17.0)
MCHC: 35.7 g/dL (ref 30.0–36.0)
MCV: 90.5 fl (ref 78.0–100.0)
Platelets: 228 10*3/uL (ref 150.0–400.0)
RBC: 4.82 Mil/uL (ref 4.22–5.81)
RDW: 13.1 % (ref 11.5–15.5)
WBC: 7.1 10*3/uL (ref 4.0–10.5)

## 2020-06-14 LAB — TSH: TSH: 4.41 u[IU]/mL (ref 0.35–4.50)

## 2020-06-14 MED ORDER — CHLORTHALIDONE 25 MG PO TABS: 25.0000 mg | ORAL_TABLET | Freq: Every day | ORAL | 1 refills | Status: DC

## 2020-06-14 NOTE — Progress Notes (Addendum)
Established Patient Office Visit  Subjective:  Patient ID: Connor Stone, male    DOB: 29-Sep-1971  Age: 49 y.o. MRN: [id number]  CC:  Chief Complaint  Patient presents with  . Transitions Of Care    toc from Dr. Deborra Medina, patient would like to follow up on elevated BP, had an episode where he passed out last week.     HPI Connor Stone presents for follow-up of hypertension.  Has been taking the HCTZ for some time now.  Says that it helps the dependent swelling in his legs.  Tells me that blood pressures at home have been as low as 140 but as high as 177 systolically.  Experienced a syncopal episode towards the end of July.  Was taken to the emergency room.  Diagnosed with a high vasovagal response and hypokalemia.  Since that time he has been eating more calcium rich foods.  He feels palpitations at times but when he checks his pulse rate it is in the 70s.  History of DVT status post knee surgery some years ago.  No family history of DVT.  Has 1-2 drinks of alcohol every other day.  Past Medical History:  Diagnosis Date  . BRCA1 positive   . DVT (deep venous thrombosis) ([place]) 2012   right leg  . Elevated blood pressure reading with diagnosis of hypertension 03/03/2019  . Family history of adverse reaction to anesthesia   . History of kidney stones 2015  . Post-operative nausea and vomiting     Past Surgical History:  Procedure Laterality Date  . KNEE ARTHROSCOPY W/ ACL RECONSTRUCTION Right   . LAPAROSCOPIC LYSIS OF ADHESIONS N/A 09/05/2016   Procedure: LAPAROSCOPIC LYSIS OF ADHESIONS;  Surgeon: Jules Husbands, MD;  Location: [facility] ORS;  Service: General;  Laterality: N/A;  . LAPAROSCOPIC PARTIAL COLECTOMY N/A 09/05/2016   Procedure: LAPAROSCOPIC PARTIAL COLECTOMY Sigmoid colectomy, splenic flexure takedown Hand Assisted lap sigmoid colectomy;  Surgeon: Jules Husbands, MD;  Location: [facility] ORS;  Service: General;  Laterality: N/A;  Modified lithotomy w Yellow fin stirrups    Family History    Problem Relation Age of Onset  . Breast cancer Mother 27       bilateral breast cancer; d. 76; no genetic testing  . Prostate cancer Father 48       no genetic testing and is an only child with a mother with cancer  . Diabetes Father   . Breast cancer Sister 32        BRCA1 positive, sister is Mel Langan (DOB 06/27/70) tested at [facility] with a report date of 01/13/2015. Result is positive for a pathogenic mutation called BRCA1, p.C61G.  . Cancer Paternal Grandmother 34       unknown type of rare cancer    Social History   Socioeconomic History  . Marital status: Married    Spouse name: Not on file  . Number of children: 1  . Years of education: Not on file  . Highest education level: Not on file  Occupational History  . Occupation: Tree surgeon  Tobacco Use  . Smoking status: Never Smoker  . Smokeless tobacco: Never Used  Vaping Use  . Vaping Use: Never used  Substance and Sexual Activity  . Alcohol use: Yes    Alcohol/week: 0.0 standard drinks    Comment: 2-4 beers / daily  . Drug use: No  . Sexual activity: Yes  Other Topics Concern  . Not on file  Social History Narrative  Married   PE teacher in middle school   28 year old daughter   Brother is an Therapist, art   Social Determinants of Radio broadcast assistant Strain:   . Difficulty of Paying Living Expenses:   Food Insecurity:   . Worried About Charity fundraiser in the Last Year:   . Arboriculturist in the Last Year:   Transportation Needs:   . Film/video editor (Medical):   Marland Kitchen Lack of Transportation (Non-Medical):   Physical Activity:   . Days of Exercise per Week:   . Minutes of Exercise per Session:   Stress:   . Feeling of Stress :   Social Connections:   . Frequency of Communication with Friends and Family:   . Frequency of Social Gatherings with Friends and Family:   . Attends Religious Services:   . Active Member of Clubs or Organizations:   . Attends Archivist Meetings:    Marland Kitchen Marital Status:   Intimate Partner Violence:   . Fear of Current or Ex-Partner:   . Emotionally Abused:   Marland Kitchen Physically Abused:   . Sexually Abused:     Outpatient Medications Prior to Visit  Medication Sig Dispense Refill  . hydrochlorothiazide (HYDRODIURIL) 25 MG tablet Take 1 tablet (25 mg total) by mouth daily. 90 tablet 3   No facility-administered medications prior to visit.    No Known Allergies  ROS Review of Systems  Constitutional: Negative for diaphoresis, fatigue, fever and unexpected weight change.  HENT: Negative.   Eyes: Negative for photophobia and visual disturbance.  Respiratory: Negative.   Cardiovascular: Negative.   Gastrointestinal: Negative.   Endocrine: Negative for polyphagia and polyuria.  Genitourinary: Negative.   Musculoskeletal: Negative for gait problem and joint swelling.  Skin: Negative for pallor and rash.  Allergic/Immunologic: Negative for immunocompromised state.  Neurological: Positive for syncope. Negative for light-headedness and headaches.  Hematological: Does not bruise/bleed easily.      Objective:    Physical Exam Constitutional:      General: He is not in acute distress.    Appearance: Normal appearance. He is normal weight. He is not ill-appearing, toxic-appearing or diaphoretic.  HENT:     Head: Normocephalic and atraumatic.     Right Ear: Tympanic membrane, ear canal and external ear normal.     Left Ear: Tympanic membrane, ear canal and external ear normal.  Eyes:     General: No scleral icterus.       Right eye: No discharge.        Left eye: No discharge.     Extraocular Movements: Extraocular movements intact.     Conjunctiva/sclera: Conjunctivae normal.     Pupils: Pupils are equal, round, and reactive to light.  Cardiovascular:     Rate and Rhythm: Normal rate and regular rhythm.  Pulmonary:     Effort: Pulmonary effort is normal.     Breath sounds: Normal breath sounds.  Abdominal:     General: Bowel  sounds are normal.  Musculoskeletal:     Cervical back: Normal range of motion and neck supple. No rigidity or tenderness.     Right lower leg: No edema.     Left lower leg: No edema.  Lymphadenopathy:     Cervical: No cervical adenopathy.  Skin:    General: Skin is warm and dry.  Neurological:     Mental Status: He is alert and oriented to person, place, and time.  Psychiatric:  Mood and Affect: Mood normal.        Behavior: Behavior normal.     BP 132/80   Pulse 73   Temp (!) 97.3 F (36.3 C) (Tympanic)   Ht '6\' 1"'  (1.854 m)   Wt 180 lb 6.4 oz (81.8 kg)   SpO2 97%   BMI 23.80 kg/m  Wt Readings from Last 3 Encounters:  06/14/20 180 lb 6.4 oz (81.8 kg)  05/23/20 179 lb (81.2 kg)  03/06/19 177 lb (80.3 kg)     Health Maintenance Due  Topic Date Due  . Hepatitis C Screening  Never done  . HIV Screening  Never done  . COLONOSCOPY  08/03/2018  . INFLUENZA VACCINE  05/29/2020    There are no preventive care reminders to display for this patient.  Lab Results  Component Value Date   TSH 3.22 03/03/2019   Lab Results  Component Value Date   WBC 8.1 05/23/2020   HGB 15.0 05/23/2020   HCT 42.3 05/23/2020   MCV 88.7 05/23/2020   PLT 214 05/23/2020   Lab Results  Component Value Date   NA 138 05/23/2020   K 2.9 (L) 05/23/2020   CO2 29 05/23/2020   GLUCOSE 156 (H) 05/23/2020   BUN 8 05/23/2020   CREATININE 0.96 05/23/2020   BILITOT 0.6 03/24/2019   ALKPHOS 47 03/03/2019   AST 18 03/24/2019   ALT 24 03/24/2019   PROT 6.7 03/24/2019   ALBUMIN 4.6 03/03/2019   CALCIUM 8.4 (L) 05/23/2020   ANIONGAP 8 05/23/2020   GFR 97.78 03/03/2019   Lab Results  Component Value Date   CHOL 185 02/15/2015   Lab Results  Component Value Date   HDL 49.70 02/15/2015   Lab Results  Component Value Date   LDLCALC 120 (H) 02/15/2015   Lab Results  Component Value Date   TRIG 76.0 02/15/2015   Lab Results  Component Value Date   CHOLHDL 4 02/15/2015   No  results found for: HGBA1C    Assessment & Plan:   Problem List Items Addressed This Visit      Cardiovascular and Mediastinum   Essential hypertension - Primary   Relevant Medications   chlorthalidone (HYGROTON) 25 MG tablet   Other Relevant Orders   CBC   Comprehensive metabolic panel   Urinalysis, Routine w reflex microscopic   TSH   Microalbumin / creatinine urine ratio     Other   Healthcare maintenance   Relevant Orders   Lipid panel   Hypokalemia   Relevant Orders   Comprehensive metabolic panel      Meds ordered this encounter  Medications  . chlorthalidone (HYGROTON) 25 MG tablet    Sig: Take 1 tablet (25 mg total) by mouth daily.    Dispense:  90 tablet    Refill:  1    Follow-up: Return in about 1 month (around 07/15/2020), or check and record bps. bring cuff with you for visit in one month..   Given information on managing hypertension.  Will switch to chlorthalidone for better 24-hour coverage.  May need to add potassium. Libby Maw, MD

## 2020-06-14 NOTE — Patient Instructions (Signed)
   Managing Your Hypertension Hypertension is commonly called high blood pressure. This is when the force of your blood pressing against the walls of your arteries is too strong. Arteries are blood vessels that carry blood from your heart throughout your body. Hypertension forces the heart to work harder to pump blood, and may cause the arteries to become narrow or stiff. Having untreated or uncontrolled hypertension can cause heart attack, stroke, kidney disease, and other problems. What are blood pressure readings? A blood pressure reading consists of a higher number over a lower number. Ideally, your blood pressure should be below 120/80. The first ("top") number is called the systolic pressure. It is a measure of the pressure in your arteries as your heart beats. The second ("bottom") number is called the diastolic pressure. It is a measure of the pressure in your arteries as the heart relaxes. What does my blood pressure reading mean? Blood pressure is classified into four stages. Based on your blood pressure reading, your health care provider may use the following stages to determine what type of treatment you need, if any. Systolic pressure and diastolic pressure are measured in a unit called mm Hg. Normal  Systolic pressure: below 120.  Diastolic pressure: below 80. Elevated  Systolic pressure: 120-129.  Diastolic pressure: below 80. Hypertension stage 1  Systolic pressure: 130-139.  Diastolic pressure: 80-89. Hypertension stage 2  Systolic pressure: 140 or above.  Diastolic pressure: 90 or above. What health risks are associated with hypertension? Managing your hypertension is an important responsibility. Uncontrolled hypertension can lead to:  A heart attack.  A stroke.  A weakened blood vessel (aneurysm).  Heart failure.  Kidney damage.  Eye damage.  Metabolic syndrome.  Memory and concentration problems. What changes can I make to manage my  hypertension? Hypertension can be managed by making lifestyle changes and possibly by taking medicines. Your health care provider will help you make a plan to bring your blood pressure within a normal range. Eating and drinking   Eat a diet that is high in fiber and potassium, and low in salt (sodium), added sugar, and fat. An example eating plan is called the DASH (Dietary Approaches to Stop Hypertension) diet. To eat this way: ? Eat plenty of fresh fruits and vegetables. Try to fill half of your plate at each meal with fruits and vegetables. ? Eat whole grains, such as whole wheat pasta, brown rice, or whole grain bread. Fill about one quarter of your plate with whole grains. ? Eat low-fat diary products. ? Avoid fatty cuts of meat, processed or cured meats, and poultry with skin. Fill about one quarter of your plate with lean proteins such as fish, chicken without skin, beans, eggs, and tofu. ? Avoid premade and processed foods. These tend to be higher in sodium, added sugar, and fat.  Reduce your daily sodium intake. Most people with hypertension should eat less than 1,500 mg of sodium a day.  Limit alcohol intake to no more than 1 drink a day for nonpregnant women and 2 drinks a day for men. One drink equals 12 oz of beer, 5 oz of wine, or 1 oz of hard liquor. Lifestyle  Work with your health care provider to maintain a healthy body weight, or to lose weight. Ask what an ideal weight is for you.  Get at least 30 minutes of exercise that causes your heart to beat faster (aerobic exercise) most days of the week. Activities may include walking, swimming, or biking.    Include exercise to strengthen your muscles (resistance exercise), such as weight lifting, as part of your weekly exercise routine. Try to do these types of exercises for 30 minutes at least 3 days a week.  Do not use any products that contain nicotine or tobacco, such as cigarettes and e-cigarettes. If you need help quitting,  ask your health care provider.  Control any long-term (chronic) conditions you have, such as high cholesterol or diabetes. Monitoring  Monitor your blood pressure at home as told by your health care provider. Your personal target blood pressure may vary depending on your medical conditions, your age, and other factors.  Have your blood pressure checked regularly, as often as told by your health care provider. Working with your health care provider  Review all the medicines you take with your health care provider because there may be side effects or interactions.  Talk with your health care provider about your diet, exercise habits, and other lifestyle factors that may be contributing to hypertension.  Visit your health care provider regularly. Your health care provider can help you create and adjust your plan for managing hypertension. Will I need medicine to control my blood pressure? Your health care provider may prescribe medicine if lifestyle changes are not enough to get your blood pressure under control, and if:  Your systolic blood pressure is 130 or higher.  Your diastolic blood pressure is 80 or higher. Take medicines only as told by your health care provider. Follow the directions carefully. Blood pressure medicines must be taken as prescribed. The medicine does not work as well when you skip doses. Skipping doses also puts you at risk for problems. Contact a health care provider if:  You think you are having a reaction to medicines you have taken.  You have repeated (recurrent) headaches.  You feel dizzy.  You have swelling in your ankles.  You have trouble with your vision. Get help right away if:  You develop a severe headache or confusion.  You have unusual weakness or numbness, or you feel faint.  You have severe pain in your chest or abdomen.  You vomit repeatedly.  You have trouble breathing. Summary  Hypertension is when the force of blood pumping  through your arteries is too strong. If this condition is not controlled, it may put you at risk for serious complications.  Your personal target blood pressure may vary depending on your medical conditions, your age, and other factors. For most people, a normal blood pressure is less than 120/80.  Hypertension is managed by lifestyle changes, medicines, or both. Lifestyle changes include weight loss, eating a healthy, low-sodium diet, exercising more, and limiting alcohol. This information is not intended to replace advice given to you by your health care provider. Make sure you discuss any questions you have with your health care provider. Document Revised: 02/06/2019 Document Reviewed: 09/12/2016 Elsevier Patient Education  2020 Elsevier Inc.  

## 2020-11-23 ENCOUNTER — Other Ambulatory Visit: Payer: Self-pay

## 2020-11-23 ENCOUNTER — Encounter: Payer: Self-pay | Admitting: Physician Assistant

## 2020-11-23 ENCOUNTER — Telehealth (INDEPENDENT_AMBULATORY_CARE_PROVIDER_SITE_OTHER): Payer: BC Managed Care – PPO | Admitting: Physician Assistant

## 2020-11-23 VITALS — BP 162/91 | Temp 98.0°F | Ht 73.0 in | Wt 185.0 lb

## 2020-11-23 DIAGNOSIS — G4452 New daily persistent headache (NDPH): Secondary | ICD-10-CM

## 2020-11-23 DIAGNOSIS — U071 COVID-19: Secondary | ICD-10-CM

## 2020-11-23 MED ORDER — CYCLOBENZAPRINE HCL 10 MG PO TABS
10.0000 mg | ORAL_TABLET | Freq: Three times a day (TID) | ORAL | 0 refills | Status: DC | PRN
Start: 2020-11-23 — End: 2022-03-22

## 2020-11-23 MED ORDER — PROCHLORPERAZINE MALEATE 5 MG PO TABS
5.0000 mg | ORAL_TABLET | Freq: Four times a day (QID) | ORAL | 0 refills | Status: DC | PRN
Start: 2020-11-23 — End: 2021-09-14

## 2020-11-23 NOTE — Progress Notes (Signed)
TELEPHONE ENCOUNTER   Patient verbally agreed to telephone visit and is aware that copayment and coinsurance may apply. Patient was treated using telemedicine according to accepted telemedicine protocols.  Location of the patient: home Location of provider: [place] of all persons participating in the telemedicine service and role in the encounter: Connor Stone, [place] , Connor Stone (patient)  Subjective:   Chief Complaint  Patient presents with  . Covid Positive     HPI   COVID-19 Symptom onset: Started on 01/16  Travel/contacts: PE teacher  Vaccination status: No  Testing results: Home test on 1/18 & 1/25 -- both were positive  Patient endorses the following symptoms: Fever x 1 day, cough, congestion and some nausea, loss sense of taste and smell.  His worst symptom at this point is HA x 4 days. Has taken: tylenol, ibuprofen, naproxen, imitrex (wife's). States he is having the worst HA of his life. Currently a 4/10, and has had some nausea. States that headache will increase in severity and decrease, but now is 4/10. Denies: vision changes, slurred speech, confusion, weakness on one side of the body, chest pain, SOB.  Denies prior migraine hx.  Does have HTN and takes chlorthalidone 25 mg daily. Current BP on our call with home cuff is 162/91.   Patient risk factors: Current [name] risk of complications score: 2 Smoking status: Connor Stone  reports that he has never smoked. He has never used smokeless tobacco. If male, currently pregnant? '[]'   Yes '[]'   No  ROS: See pertinent positives and negatives per HPI.  Patient Active Problem List   Diagnosis Date Noted  . Alcohol use 06/14/2020  . Hypokalemia 06/14/2020  . Essential hypertension 03/03/2019  . S/P laparoscopic colectomy 09/05/2016  . Diverticulitis of large intestine with abscess without bleeding 03/05/2016  . Pericolonic abscess due to diverticulitis   . Change in bowel habits  05/24/2015  . 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 Blood in stool 05/24/2015  . Healthcare maintenance 05/10/2015  . Family history of malignant neoplasm of breast 03/21/2015  . Family history of BRCA1 gene positive 03/21/2015  . Family history of prostate cancer 03/21/2015  . Family history of BRCA gene positive 02/15/2015  . Nephrolithiasis 02/15/2015   Social History   Tobacco Use  . Smoking status: Never Smoker  . Smokeless tobacco: Never Used  Substance Use Topics  . Alcohol use: Yes    Alcohol/week: 0.0 standard drinks    Comment: 2-4 beers / daily    Current Outpatient Medications:  .  chlorthalidone (HYGROTON) 25 MG tablet, Take 1 tablet (25 mg total) by mouth daily., Disp: 90 tablet, Rfl: 1 No Known Allergies  Assessment & Plan:   1. COVID-19   2. New daily persistent headache    Patient does not sound like he is in any acute distress. I am unable to see patient due to lack of video during our call.   Current COVID-19 dx with new daily persistent HA x 4 days. BP is currently elevated. My recommendation was for patient to go to the ER for further evaluation vs stat head CT as outpatient to r/o CVA or other cause of symptoms. Patient refused this recommendation. He verbalized understanding that with his ongoing symptoms we cannot r/o stroke, aneurysm or other life-threatening illness if he does not undergo further evaluation.  He has requested flexeril and compazine for his symptoms. I have provided a short prescription for these medications. I recommended close monitoring of  symptoms and blood pressure. I recommended he reconsider ER evaluation if BP/HA or other symptoms worsen.  No orders of the defined types were placed in this encounter.  No orders of the defined types were placed in this encounter.   Connor Stone, [place] 11/23/2020  Time spent with the patient: 10 minutes, spent in obtaining information about his symptoms, reviewing his previous labs, evaluations,  and treatments, counseling him about his condition (please see the discussed topics above), and developing a plan to further investigate it; he had a number of questions which I addressed.

## 2020-12-03 ENCOUNTER — Other Ambulatory Visit: Payer: Self-pay | Admitting: Family Medicine

## 2020-12-03 DIAGNOSIS — I10 Essential (primary) hypertension: Secondary | ICD-10-CM

## 2021-03-22 ENCOUNTER — Other Ambulatory Visit: Payer: Self-pay | Admitting: Family

## 2021-03-22 DIAGNOSIS — I10 Essential (primary) hypertension: Secondary | ICD-10-CM

## 2021-08-16 ENCOUNTER — Telehealth: Payer: Self-pay | Admitting: Family Medicine

## 2021-08-16 NOTE — Telephone Encounter (Signed)
Spoke with patient who's aware that he should not have stopped taking medication on his own due to feeling okay. Per patient he would like to be back on medication a little before he comes back in appointment scheduled for follow up.

## 2021-09-13 ENCOUNTER — Other Ambulatory Visit: Payer: Self-pay

## 2021-09-14 ENCOUNTER — Encounter: Payer: Self-pay | Admitting: Family Medicine

## 2021-09-14 ENCOUNTER — Ambulatory Visit (INDEPENDENT_AMBULATORY_CARE_PROVIDER_SITE_OTHER): Payer: BC Managed Care – PPO | Admitting: Family Medicine

## 2021-09-14 ENCOUNTER — Other Ambulatory Visit: Payer: Self-pay | Admitting: Family Medicine

## 2021-09-14 VITALS — BP 142/88 | HR 79 | Temp 97.8°F | Ht 73.0 in | Wt 192.4 lb

## 2021-09-14 DIAGNOSIS — I10 Essential (primary) hypertension: Secondary | ICD-10-CM

## 2021-09-14 DIAGNOSIS — G4452 New daily persistent headache (NDPH): Secondary | ICD-10-CM

## 2021-09-14 DIAGNOSIS — Z566 Other physical and mental strain related to work: Secondary | ICD-10-CM

## 2021-09-14 DIAGNOSIS — R7989 Other specified abnormal findings of blood chemistry: Secondary | ICD-10-CM

## 2021-09-14 DIAGNOSIS — F439 Reaction to severe stress, unspecified: Secondary | ICD-10-CM

## 2021-09-14 DIAGNOSIS — L639 Alopecia areata, unspecified: Secondary | ICD-10-CM

## 2021-09-14 DIAGNOSIS — E876 Hypokalemia: Secondary | ICD-10-CM

## 2021-09-14 LAB — CBC
HCT: 45.3 % (ref 39.0–52.0)
Hemoglobin: 15.7 g/dL (ref 13.0–17.0)
MCHC: 34.6 g/dL (ref 30.0–36.0)
MCV: 88.8 fl (ref 78.0–100.0)
Platelets: 238 10*3/uL (ref 150.0–400.0)
RBC: 5.1 Mil/uL (ref 4.22–5.81)
RDW: 13.2 % (ref 11.5–15.5)
WBC: 6.8 10*3/uL (ref 4.0–10.5)

## 2021-09-14 LAB — BASIC METABOLIC PANEL
BUN: 12 mg/dL (ref 6–23)
CO2: 32 mEq/L (ref 19–32)
Calcium: 9.7 mg/dL (ref 8.4–10.5)
Chloride: 96 mEq/L (ref 96–112)
Creatinine, Ser: 0.92 mg/dL (ref 0.40–1.50)
GFR: 97.38 mL/min (ref >60.00)
Glucose, Bld: 86 mg/dL (ref 70–99)
Potassium: 3.3 mEq/L — ABNORMAL LOW (ref 3.5–5.1)
Sodium: 137 mEq/L (ref 135–145)

## 2021-09-14 LAB — TSH: TSH: 3.1 u[IU]/mL (ref 0.35–5.50)

## 2021-09-14 MED ORDER — AMLODIPINE BESYLATE 5 MG PO TABS: 5.0000 mg | ORAL_TABLET | Freq: Every day | ORAL | 1 refills | Status: DC

## 2021-09-14 MED ORDER — POTASSIUM CHLORIDE CRYS ER 20 MEQ PO TBCR: 20.0000 meq | EXTENDED_RELEASE_TABLET | Freq: Every day | ORAL | 3 refills | Status: DC

## 2021-09-14 MED ORDER — CHLORTHALIDONE 25 MG PO TABS: 25.0000 mg | ORAL_TABLET | Freq: Every day | ORAL | 1 refills | Status: DC

## 2021-09-14 MED ORDER — PAROXETINE HCL 10 MG PO TABS: ORAL_TABLET | ORAL | 1 refills | Status: DC

## 2021-09-14 NOTE — Addendum Note (Signed)
Addended by: Jon Billings on: 09/14/2021 05:29 PM   Modules accepted: Orders

## 2021-09-14 NOTE — Progress Notes (Addendum)
[facility]   Established Patient Office Visit  Subjective:  Patient ID: Connor Stone, male    DOB: [birth year]  Age: 50 y.o. MRN: [id number]  CC:  Chief Complaint  Patient presents with   Follow-up    Follow up on BP and medications. Some headaches.     HPI Connor Stone presents for follow-up of his blood pressure.  Lost to follow-up over the last year or so.  He had discontinued his blood pressure medicine several months ago.  Over the last few months he developed severe headaches in the back of his head and noticed his blood pressure spiking as high as 200/100.  He restarted the chlorthalidone pressure came down but still is dealing with posterior occiput headaches.  Somewhat improved blood pressure and headache intensity.  Headaches are not progressive.  No difficulty swallowing or unilateral weakness numbness or tingling.  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  He is dealing with aftermath of a divorce and a 57 year old daughter.  Daughter's mother has been drinking heavily heavily.  Patient has cut back significantly on his own drinking.  Nothing to drink for over a month now.  Past Medical History:  Diagnosis Date   BRCA1 positive    DVT (deep venous thrombosis) ([place]) 2012   right leg   Elevated blood pressure reading with diagnosis of hypertension 03/03/2019   Family history of adverse reaction to anesthesia    History of kidney stones 2015   Post-operative nausea and vomiting     Past Surgical History:  Procedure Laterality Date   KNEE ARTHROSCOPY W/ ACL RECONSTRUCTION Right    LAPAROSCOPIC LYSIS OF ADHESIONS N/A 09/05/2016   Procedure: LAPAROSCOPIC LYSIS OF ADHESIONS;  Surgeon: Jules Husbands, MD;  Location: [facility] ORS;  Service: General;  Laterality: N/A;   LAPAROSCOPIC PARTIAL COLECTOMY N/A 09/05/2016   Procedure: LAPAROSCOPIC PARTIAL COLECTOMY Sigmoid colectomy, splenic flexure takedown Hand Assisted lap sigmoid colectomy;  Surgeon: Jules Husbands, MD;   Location: [facility] ORS;  Service: General;  Laterality: N/A;  Modified lithotomy w Yellow fin stirrups    Family History  Problem Relation Age of Onset   Breast cancer Mother 38       bilateral breast cancer; d. 67; no genetic testing   Prostate cancer Father 89       no genetic testing and is an only child with a mother with cancer   Diabetes Father    Breast cancer Sister 47        BRCA1 positive, sister is Zedekiah Hinderman (DOB 06/27/70) tested at [facility] with a report date of 01/13/2015. Result is positive for a pathogenic mutation called BRCA1, p.C61G.   Cancer Paternal Grandmother 42       unknown type of rare cancer    Social History   Socioeconomic History   Marital status: Married    Spouse name: Not on file   Number of children: 1   Years of education: Not on file   Highest education level: Not on file  Occupational History   Occupation: Tree surgeon  Tobacco Use   Smoking status: Never   Smokeless tobacco: Never  Vaping Use   Vaping Use: Never used  Substance and Sexual Activity   Alcohol use: Yes    Alcohol/week: 0.0 standard drinks    Comment: 2-4 beers / daily   Drug use: No   Sexual activity: Yes  Other Topics Concern   Not on file  Social History  Narrative   Married   PE teacher in middle school   64 year old daughter   Brother is an Therapist, art   Social Determinants of Radio broadcast assistant Strain: Not on file  Food Insecurity: Not on file  Transportation Needs: Not on file  Physical Activity: Not on file  Stress: Not on file  Social Connections: Not on file  Intimate Partner Violence: Not on file    Outpatient Medications Prior to Visit  Medication Sig Dispense Refill   cyclobenzaprine (FLEXERIL) 10 MG tablet Take 1 tablet (10 mg total) by mouth 3 (three) times daily as needed for muscle spasms. 30 tablet 0   chlorthalidone (HYGROTON) 25 MG tablet TAKE 1 TABLET BY MOUTH EVERY DAY 90 tablet 1   prochlorperazine (COMPAZINE) 5 MG tablet Take 1  tablet (5 mg total) by mouth every 6 (six) hours as needed for nausea or vomiting. 30 tablet 0   No facility-administered medications prior to visit.    No Known Allergies  ROS Review of Systems  Constitutional:  Negative for diaphoresis, fatigue, fever and unexpected weight change.  HENT: Negative.    Respiratory: Negative.    Cardiovascular: Negative.   Gastrointestinal: Negative.   Genitourinary: Negative.   Neurological:  Positive for headaches. Negative for speech difficulty, weakness and light-headedness.  Psychiatric/Behavioral:  Positive for dysphoric mood and sleep disturbance. The patient is nervous/anxious.   Depression screen [facility] 2/9 09/14/2021 09/14/2021 06/14/2020  Decreased Interest 2 0 0  Down, Depressed, Hopeless 2 0 0  PHQ - 2 Score 4 0 0  Altered sleeping 2 - -  Tired, decreased energy 1 - -  Change in appetite 0 - -  Feeling bad or failure about yourself  2 - -  Trouble concentrating 0 - -  Moving slowly or fidgety/restless 1 - -  Suicidal thoughts 0 - -  PHQ-9 Score 10 - -  Difficult doing work/chores Somewhat difficult - -       Objective:    Physical Exam Vitals and nursing note reviewed.  Constitutional:      General: He is not in acute distress.    Appearance: Normal appearance. He is normal weight. He is not ill-appearing, toxic-appearing or diaphoretic.  HENT:     Head: Normocephalic and atraumatic.     Right Ear: Tympanic membrane, ear canal and external ear normal.     Left Ear: Tympanic membrane, ear canal and external ear normal.     Mouth/Throat:     Mouth: Mucous membranes are moist.     Pharynx: Oropharynx is clear. No posterior oropharyngeal erythema.  Eyes:     General: No visual field deficit or scleral icterus.       Right eye: No discharge.        Left eye: No discharge.     Extraocular Movements: Extraocular movements intact.     Conjunctiva/sclera: Conjunctivae normal.     Pupils: Pupils are equal, round, and reactive to  light.  Cardiovascular:     Rate and Rhythm: Normal rate and regular rhythm.  Pulmonary:     Effort: Pulmonary effort is normal.     Breath sounds: Normal breath sounds.  Abdominal:     General: Bowel sounds are normal.  Musculoskeletal:     Cervical back: No rigidity or tenderness.     Right lower leg: No edema.     Left lower leg: No edema.  Lymphadenopathy:     Cervical: No cervical adenopathy.  Skin:  Comments: Areas of alopecia lateral eyebrows and in the scalp.  Neurological:     Mental Status: He is alert and oriented to person, place, and time.     Cranial Nerves: No cranial nerve deficit, dysarthria or facial asymmetry.  Psychiatric:        Mood and Affect: Mood normal.        Behavior: Behavior normal.    BP (!) 142/88 (BP Location: Right Arm, Patient Position: Sitting, Cuff Size: Large)   Pulse 79   Temp 97.8 F (36.6 C) (Temporal)   Ht '6\' 1"'  (1.854 m)   Wt 192 lb 6.4 oz (87.3 kg)   SpO2 98%   BMI 25.38 kg/m  Wt Readings from Last 3 Encounters:  09/14/21 192 lb 6.4 oz (87.3 kg)  11/23/20 185 lb (83.9 kg)  06/14/20 180 lb 6.4 oz (81.8 kg)     Health Maintenance Due  Topic Date Due   HIV Screening  Never done   Hepatitis C Screening  Never done   COLONOSCOPY (Pts 45-65yr Insurance coverage will need to be confirmed)  08/03/2018    There are no preventive care reminders to display for this patient.  Lab Results  Component Value Date   TSH 3.10 09/14/2021   Lab Results  Component Value Date   WBC 6.8 09/14/2021   HGB 15.7 09/14/2021   HCT 45.3 09/14/2021   MCV 88.8 09/14/2021   PLT 238.0 09/14/2021   Lab Results  Component Value Date   NA 137 09/14/2021   K 3.3 (L) 09/14/2021   CO2 32 09/14/2021   GLUCOSE 86 09/14/2021   BUN 12 09/14/2021   CREATININE 0.92 09/14/2021   BILITOT 0.9 06/14/2020   ALKPHOS 44 06/14/2020   AST 15 06/14/2020   ALT 19 06/14/2020   PROT 6.7 06/14/2020   ALBUMIN 4.5 06/14/2020   CALCIUM 9.7 09/14/2021    ANIONGAP 8 05/23/2020   GFR 97.38 09/14/2021   Lab Results  Component Value Date   CHOL 167 06/14/2020   Lab Results  Component Value Date   HDL 46.70 06/14/2020   Lab Results  Component Value Date   LDLCALC 102 (H) 06/14/2020   Lab Results  Component Value Date   TRIG 91.0 06/14/2020   Lab Results  Component Value Date   CHOLHDL 4 06/14/2020   No results found for: HGBA1C    Assessment & Plan:   Problem List Items Addressed This Visit       Cardiovascular and Mediastinum   Essential hypertension - Primary   Relevant Medications   amLODipine (NORVASC) 5 MG tablet   chlorthalidone (HYGROTON) 25 MG tablet   potassium chloride SA (KLOR-CON) 20 MEQ tablet   Other Relevant Orders   CBC (Completed)   Basic metabolic panel (Completed)     Musculoskeletal and Integument   Alopecia areata     Other   Hypokalemia   Relevant Medications   potassium chloride SA (KLOR-CON) 20 MEQ tablet   Stress at home   Stress at work   New daily persistent headache   Relevant Medications   amLODipine (NORVASC) 5 MG tablet   Elevated TSH   Relevant Orders   TSH (Completed)    Meds ordered this encounter  Medications   amLODipine (NORVASC) 5 MG tablet    Sig: Take 1 tablet (5 mg total) by mouth daily.    Dispense:  90 tablet    Refill:  1   chlorthalidone (HYGROTON) 25 MG tablet    Sig:  Take 1 tablet (25 mg total) by mouth daily.    Dispense:  90 tablet    Refill:  1    DX Code Needed  .   DISCONTD: PARoxetine (PAXIL) 10 MG tablet    Sig: Take one daily for one week and then increase to 2 daily.    Dispense:  60 tablet    Refill:  1   potassium chloride SA (KLOR-CON) 20 MEQ tablet    Sig: Take 1 tablet (20 mEq total) by mouth daily.    Dispense:  30 tablet    Refill:  3    Follow-up: Return in about 6 weeks (around 10/26/2021), or if symptoms worsen or fail to improve.   Will add amlodipine.  Given information on managing hypertension.  We will start Paxil.  We  will adjust it to the time of day that works best for him.  Also given information on the mindfulness based stress reduction.  Given information on alopecia.  Believe the headaches and blood pressure will improve with stress reduction and Paxil therapy. Libby Maw, MD did you order it okay let me know all right good

## 2021-10-06 ENCOUNTER — Other Ambulatory Visit: Payer: Self-pay | Admitting: Family Medicine

## 2021-10-06 DIAGNOSIS — Z566 Other physical and mental strain related to work: Secondary | ICD-10-CM

## 2021-10-06 DIAGNOSIS — F439 Reaction to severe stress, unspecified: Secondary | ICD-10-CM

## 2021-10-06 NOTE — Telephone Encounter (Signed)
Refill request for:  Paxil 20 mg LR 09/14/21, #30, 1 rf LOV 09/14/21 FOV  11/02/21  REQUEST FOR 90 DAYS PRESCRIPTION. DX Code Needed.  Please review and advise.   Thanks. Dm/cma

## 2021-10-18 ENCOUNTER — Ambulatory Visit: Payer: Self-pay | Admitting: Family Medicine

## 2021-11-02 ENCOUNTER — Ambulatory Visit: Payer: BC Managed Care – PPO | Admitting: Family Medicine

## 2021-11-02 ENCOUNTER — Other Ambulatory Visit: Payer: Self-pay

## 2021-11-02 ENCOUNTER — Encounter: Payer: Self-pay | Admitting: Family Medicine

## 2021-11-02 VITALS — BP 122/76 | HR 71 | Temp 97.3°F | Ht 73.0 in | Wt 190.0 lb

## 2021-11-02 DIAGNOSIS — L659 Nonscarring hair loss, unspecified: Secondary | ICD-10-CM | POA: Diagnosis not present

## 2021-11-02 DIAGNOSIS — L639 Alopecia areata, unspecified: Secondary | ICD-10-CM | POA: Diagnosis not present

## 2021-11-02 DIAGNOSIS — I10 Essential (primary) hypertension: Secondary | ICD-10-CM

## 2021-11-02 DIAGNOSIS — F439 Reaction to severe stress, unspecified: Secondary | ICD-10-CM

## 2021-11-02 MED ORDER — AMLODIPINE BESYLATE 5 MG PO TABS: 5.0000 mg | ORAL_TABLET | Freq: Every day | ORAL | 1 refills | Status: DC

## 2021-11-02 MED ORDER — CHLORTHALIDONE 25 MG PO TABS: 25.0000 mg | ORAL_TABLET | Freq: Every day | ORAL | 1 refills | Status: DC

## 2021-11-02 MED ORDER — PAROXETINE HCL 10 MG PO TABS: 10.0000 mg | ORAL_TABLET | Freq: Every day | ORAL | 1 refills | Status: DC

## 2021-11-02 NOTE — Progress Notes (Signed)
Established Patient Office Visit  Subjective:  Patient ID: Connor Stone, male    DOB: 08-Jan-1971  Age: 51 y.o. MRN: [id number]  CC:  Chief Complaint  Patient presents with   Follow-up    Follow up on BP, no concerns.     HPI Connor Stone presents for follow-up of blood pressure and family stress.  Paxil has been helping a great deal.  He is settled on the 10 mg dose that has been most helpful.  Mood is definitely been lifted there is less anxiety.  His sleeping is improved.  Blood pressure at home has been running in the 120s over 70s.  Occasional excursions up into the 130s over 80s.  Ongoing stress with his parents and 67 year old daughter.  Daughter is with her mom and is refusing to communicate with him.  He is seeking counseling.  Past Medical History:  Diagnosis Date   BRCA1 positive    DVT (deep venous thrombosis) ([place]) 2012   right leg   Elevated blood pressure reading with diagnosis of hypertension 03/03/2019   Family history of adverse reaction to anesthesia    History of kidney stones 2015   Post-operative nausea and vomiting     Past Surgical History:  Procedure Laterality Date   KNEE ARTHROSCOPY W/ ACL RECONSTRUCTION Right    LAPAROSCOPIC LYSIS OF ADHESIONS N/A 09/05/2016   Procedure: LAPAROSCOPIC LYSIS OF ADHESIONS;  Surgeon: Jules Husbands, MD;  Location: [facility] ORS;  Service: General;  Laterality: N/A;   LAPAROSCOPIC PARTIAL COLECTOMY N/A 09/05/2016   Procedure: LAPAROSCOPIC PARTIAL COLECTOMY Sigmoid colectomy, splenic flexure takedown Hand Assisted lap sigmoid colectomy;  Surgeon: Jules Husbands, MD;  Location: [facility] ORS;  Service: General;  Laterality: N/A;  Modified lithotomy w Yellow fin stirrups    Family History  Problem Relation Age of Onset   Breast cancer Mother 16       bilateral breast cancer; d. 68; no genetic testing   Prostate cancer Father 52       no genetic testing and is an only child with a mother with cancer   Diabetes Father    Breast cancer  Sister 43        BRCA1 positive, sister is Dabid Godown (DOB 06/27/70) tested at [facility] with a report date of 01/13/2015. Result is positive for a pathogenic mutation called BRCA1, p.C61G.   Cancer Paternal Grandmother 67       unknown type of rare cancer    Social History   Socioeconomic History   Marital status: Married    Spouse name: Not on file   Number of children: 1   Years of education: Not on file   Highest education level: Not on file  Occupational History   Occupation: Tree surgeon  Tobacco Use   Smoking status: Never   Smokeless tobacco: Never  Vaping Use   Vaping Use: Never used  Substance and Sexual Activity   Alcohol use: Yes    Alcohol/week: 0.0 standard drinks    Comment: 2-4 beers / daily   Drug use: No   Sexual activity: Yes  Other Topics Concern   Not on file  Social History Narrative   Married   PE teacher in middle school   25 year old daughter   Brother is an Therapist, art   Social Determinants of Radio broadcast assistant Strain: Not on file  Food Insecurity: Not on file  Transportation Needs: Not on file  Physical Activity: Not on file  Stress: Not on file  Social Connections: Not on file  Intimate Partner Violence: Not on file    Outpatient Medications Prior to Visit  Medication Sig Dispense Refill   cyclobenzaprine (FLEXERIL) 10 MG tablet Take 1 tablet (10 mg total) by mouth 3 (three) times daily as needed for muscle spasms. 30 tablet 0   potassium chloride SA (KLOR-CON) 20 MEQ tablet Take 1 tablet (20 mEq total) by mouth daily. 30 tablet 3   amLODipine (NORVASC) 5 MG tablet Take 1 tablet (5 mg total) by mouth daily. 90 tablet 1   chlorthalidone (HYGROTON) 25 MG tablet Take 1 tablet (25 mg total) by mouth daily. 90 tablet 1   PARoxetine (PAXIL) 20 MG tablet Take one half daily for 7 days and then increase to 1 full tablet daily 30 tablet 1   No facility-administered medications prior to visit.    No Known Allergies  ROS Review of  Systems  Constitutional:  Negative for diaphoresis, fatigue, fever and unexpected weight change.  HENT: Negative.    Eyes:  Negative for photophobia and visual disturbance.  Respiratory: Negative.    Cardiovascular: Negative.   Gastrointestinal: Negative.   Genitourinary: Negative.   Neurological:  Negative for speech difficulty and weakness.     Objective:    Physical Exam Vitals and nursing note reviewed.  Constitutional:      General: He is not in acute distress.    Appearance: Normal appearance. He is obese. He is not ill-appearing, toxic-appearing or diaphoretic.  HENT:     Head: Normocephalic and atraumatic.     Right Ear: External ear normal.     Left Ear: External ear normal.     Mouth/Throat:     Mouth: Mucous membranes are dry.     Pharynx: Oropharynx is clear. No oropharyngeal exudate or posterior oropharyngeal erythema.  Eyes:     General: No scleral icterus.       Right eye: No discharge.        Left eye: No discharge.     Extraocular Movements: Extraocular movements intact.     Conjunctiva/sclera: Conjunctivae normal.     Pupils: Pupils are equal, round, and reactive to light.  Cardiovascular:     Rate and Rhythm: Normal rate and regular rhythm.  Pulmonary:     Effort: Pulmonary effort is normal.     Breath sounds: Normal breath sounds.  Abdominal:     General: Bowel sounds are normal.  Musculoskeletal:     Cervical back: No rigidity or tenderness.  Lymphadenopathy:     Cervical: No cervical adenopathy.  Skin:    General: Skin is warm and dry.       Neurological:     Mental Status: He is alert and oriented to person, place, and time.  Psychiatric:        Mood and Affect: Mood normal.        Behavior: Behavior normal.    BP 122/76 (BP Location: Right Arm, Patient Position: Sitting, Cuff Size: Normal)    Pulse 71    Temp (!) 97.3 F (36.3 C) (Temporal)    Ht '6\' 1"'  (1.854 m)    Wt 190 lb (86.2 kg)    SpO2 97%    BMI 25.07 kg/m  Wt Readings from Last  3 Encounters:  11/02/21 190 lb (86.2 kg)  09/14/21 192 lb 6.4 oz (87.3 kg)  11/23/20 185 lb (83.9 kg)     Health Maintenance Due  Topic Date Due   HIV Screening  Never done   Hepatitis C Screening  Never done   COLONOSCOPY (Pts 45-63yr Insurance coverage will need to be confirmed)  08/03/2018   Zoster Vaccines- Shingrix (1 of 2) Never done    There are no preventive care reminders to display for this patient.  Lab Results  Component Value Date   TSH 3.10 09/14/2021   Lab Results  Component Value Date   WBC 6.8 09/14/2021   HGB 15.7 09/14/2021   HCT 45.3 09/14/2021   MCV 88.8 09/14/2021   PLT 238.0 09/14/2021   Lab Results  Component Value Date   NA 137 09/14/2021   K 3.3 (L) 09/14/2021   CO2 32 09/14/2021   GLUCOSE 86 09/14/2021   BUN 12 09/14/2021   CREATININE 0.92 09/14/2021   BILITOT 0.9 06/14/2020   ALKPHOS 44 06/14/2020   AST 15 06/14/2020   ALT 19 06/14/2020   PROT 6.7 06/14/2020   ALBUMIN 4.5 06/14/2020   CALCIUM 9.7 09/14/2021   ANIONGAP 8 05/23/2020   GFR 97.38 09/14/2021   Lab Results  Component Value Date   CHOL 167 06/14/2020   Lab Results  Component Value Date   HDL 46.70 06/14/2020   Lab Results  Component Value Date   LDLCALC 102 (H) 06/14/2020   Lab Results  Component Value Date   TRIG 91.0 06/14/2020   Lab Results  Component Value Date   CHOLHDL 4 06/14/2020   No results found for: HGBA1C    Assessment & Plan:   Problem List Items Addressed This Visit       Cardiovascular and Mediastinum   Essential hypertension - Primary   Relevant Medications   amLODipine (NORVASC) 5 MG tablet   chlorthalidone (HYGROTON) 25 MG tablet   Other Relevant Orders   Basic metabolic panel     Musculoskeletal and Integument   Alopecia areata   Relevant Orders   Ambulatory referral to Dermatology     Other   Stress at home   Relevant Medications   PARoxetine (PAXIL) 10 MG tablet   Other Visit Diagnoses     Loss of hair            Meds ordered this encounter  Medications   amLODipine (NORVASC) 5 MG tablet    Sig: Take 1 tablet (5 mg total) by mouth daily.    Dispense:  90 tablet    Refill:  1   chlorthalidone (HYGROTON) 25 MG tablet    Sig: Take 1 tablet (25 mg total) by mouth daily.    Dispense:  90 tablet    Refill:  1    DX Code Needed  .   PARoxetine (PAXIL) 10 MG tablet    Sig: Take 1 tablet (10 mg total) by mouth daily.    Dispense:  90 tablet    Refill:  1    Follow-up: Return in about 6 months (around 05/02/2022), or if symptoms worsen or fail to improve.    WLibby Maw MD

## 2021-11-03 LAB — BASIC METABOLIC PANEL
BUN: 12 mg/dL (ref 6–23)
CO2: 33 mEq/L — ABNORMAL HIGH (ref 19–32)
Calcium: 9.1 mg/dL (ref 8.4–10.5)
Chloride: 98 mEq/L (ref 96–112)
Creatinine, Ser: 0.86 mg/dL (ref 0.40–1.50)
GFR: 101.27 mL/min (ref >60.00)
Glucose, Bld: 78 mg/dL (ref 70–99)
Potassium: 3.5 mEq/L (ref 3.5–5.1)
Sodium: 138 mEq/L (ref 135–145)

## 2021-11-22 ENCOUNTER — Other Ambulatory Visit: Payer: Self-pay | Admitting: Family Medicine

## 2021-11-22 DIAGNOSIS — F439 Reaction to severe stress, unspecified: Secondary | ICD-10-CM

## 2021-11-22 DIAGNOSIS — Z566 Other physical and mental strain related to work: Secondary | ICD-10-CM

## 2021-11-26 ENCOUNTER — Other Ambulatory Visit: Payer: Self-pay | Admitting: Family Medicine

## 2021-11-26 DIAGNOSIS — E876 Hypokalemia: Secondary | ICD-10-CM

## 2021-11-26 DIAGNOSIS — I10 Essential (primary) hypertension: Secondary | ICD-10-CM

## 2022-03-14 ENCOUNTER — Telehealth: Payer: Self-pay | Admitting: Family Medicine

## 2022-03-14 NOTE — Telephone Encounter (Signed)
Lilli Light, [phone] ? ?MEDICATION(S): amLODipine ? ? ? ?Has the patient contacted their pharmacy (YES/NO)?  Yes, call pcp ? ? ?[facility]: [facility], [place] ? ?~~~Please advise patient/caregiver to allow 2-3 business days to process RX refills.  ?

## 2022-03-22 ENCOUNTER — Ambulatory Visit: Payer: BC Managed Care – PPO | Admitting: Family Medicine

## 2022-03-22 ENCOUNTER — Encounter: Payer: Self-pay | Admitting: Family Medicine

## 2022-03-22 VITALS — BP 126/74 | HR 75 | Temp 98.2°F | Ht 73.0 in | Wt 195.0 lb

## 2022-03-22 DIAGNOSIS — F439 Reaction to severe stress, unspecified: Secondary | ICD-10-CM

## 2022-03-22 DIAGNOSIS — S39012A Strain of muscle, fascia and tendon of lower back, initial encounter: Secondary | ICD-10-CM | POA: Insufficient documentation

## 2022-03-22 DIAGNOSIS — E876 Hypokalemia: Secondary | ICD-10-CM | POA: Diagnosis not present

## 2022-03-22 DIAGNOSIS — L639 Alopecia areata, unspecified: Secondary | ICD-10-CM

## 2022-03-22 DIAGNOSIS — L920 Granuloma annulare: Secondary | ICD-10-CM | POA: Diagnosis not present

## 2022-03-22 DIAGNOSIS — I872 Venous insufficiency (chronic) (peripheral): Secondary | ICD-10-CM

## 2022-03-22 LAB — SEDIMENTATION RATE: Sed Rate: 8 mm/hr (ref 0–20)

## 2022-03-22 LAB — BASIC METABOLIC PANEL
BUN: 11 mg/dL (ref 6–23)
CO2: 33 mEq/L — ABNORMAL HIGH (ref 19–32)
Calcium: 9.3 mg/dL (ref 8.4–10.5)
Chloride: 97 mEq/L (ref 96–112)
Creatinine, Ser: 0.86 mg/dL (ref 0.40–1.50)
GFR: 101 mL/min (ref >60.00)
Glucose, Bld: 108 mg/dL — ABNORMAL HIGH (ref 70–99)
Potassium: 3.3 mEq/L — ABNORMAL LOW (ref 3.5–5.1)
Sodium: 137 mEq/L (ref 135–145)

## 2022-03-22 MED ORDER — TRIAMCINOLONE ACETONIDE 0.5 % EX OINT: TOPICAL_OINTMENT | CUTANEOUS | 0 refills | Status: DC

## 2022-03-22 MED ORDER — METHOCARBAMOL 500 MG PO TABS: 500.0000 mg | ORAL_TABLET | Freq: Three times a day (TID) | ORAL | 0 refills | Status: DC | PRN

## 2022-03-22 NOTE — Progress Notes (Signed)
Established Patient Office Visit  Subjective   Patient ID: Connor Stone, male    DOB: 1971-06-12  Age: 51 y.o. MRN: [id number]  Chief Complaint  Patient presents with   Hair/Scalp Problem    Concerns about alopecia spreading more becoming worse.     HPI follow-up of hypokalemia, hypertension, rash on his right lower leg and a new rash on his left hand.  Alopecia seems to be responding to clobetasol.  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  He remains estranged from his daughter and this is stressful.  Paxil seems to be helping.  Strength is lower back putting something back into his refrigerator.  Feels okay but the muscles are tight.  He is doing some exercises to improve it.    Review of Systems  Constitutional: Negative.   HENT: Negative.    Eyes:  Negative for blurred vision, discharge and redness.  Respiratory: Negative.    Cardiovascular: Negative.   Gastrointestinal:  Negative for abdominal pain.  Genitourinary: Negative.   Musculoskeletal:  Positive for back pain. Negative for myalgias.  Skin:  Negative for rash.  Neurological:  Negative for tingling, loss of consciousness and weakness.  Endo/Heme/Allergies:  Negative for polydipsia.     Objective:     BP 126/74 (BP Location: Right Arm, Patient Position: Sitting, Cuff Size: Normal)   Pulse 75   Temp 98.2 F (36.8 C) (Temporal)   Ht '6\' 1"'$  (1.854 m)   Wt 195 lb (88.5 kg)   SpO2 96%   BMI 25.73 kg/m    Physical Exam Constitutional:      General: He is not in acute distress.    Appearance: Normal appearance. He is not ill-appearing, toxic-appearing or diaphoretic.  HENT:     Head: Normocephalic and atraumatic.     Right Ear: External ear normal.     Left Ear: External ear normal.  Eyes:     General: No scleral icterus.       Right eye: No discharge.        Left eye: No discharge.     Extraocular Movements: Extraocular movements intact.     Conjunctiva/sclera: Conjunctivae normal.  Pulmonary:      Effort: Pulmonary effort is normal. No respiratory distress.  Musculoskeletal:     Lumbar back: Normal range of motion.  Skin:    General: Skin is warm and dry.       Neurological:     Mental Status: He is alert and oriented to person, place, and time.  Psychiatric:        Mood and Affect: Mood normal.        Behavior: Behavior normal.     No results found for any visits on 03/22/22.    The 10-year ASCVD risk score (Arnett DK, et al., 2019) is: 3.3%    Assessment & Plan:   Problem List Items Addressed This Visit       Cardiovascular and Mediastinum   Venous insufficiency   Relevant Medications   triamcinolone ointment (KENALOG) 0.5 %     Musculoskeletal and Integument   Alopecia areata - Primary   Relevant Orders   Sedimentation rate   Strain of lumbar region   Relevant Medications   methocarbamol (ROBAXIN) 500 MG tablet   Granuloma annulare   Relevant Medications   triamcinolone ointment (KENALOG) 0.5 %     Other   Hypokalemia   Relevant Orders   Basic metabolic panel   Stress at home  Return in about 3 months (around 06/22/2022).  High potency triamcinolone ointment.  He will need to follow-up with his dermatologist about new lesion on his left hand.  Rechecking potassium.  Brief course of Robaxin  Libby Maw, MD

## 2022-03-23 ENCOUNTER — Encounter: Payer: Self-pay | Admitting: Family Medicine

## 2022-05-02 ENCOUNTER — Other Ambulatory Visit: Payer: Self-pay | Admitting: Family Medicine

## 2022-05-02 DIAGNOSIS — I10 Essential (primary) hypertension: Secondary | ICD-10-CM

## 2022-05-02 DIAGNOSIS — E876 Hypokalemia: Secondary | ICD-10-CM

## 2022-05-07 ENCOUNTER — Ambulatory Visit: Payer: BC Managed Care – PPO | Admitting: Family Medicine

## 2022-05-07 ENCOUNTER — Telehealth: Payer: Self-pay | Admitting: Family Medicine

## 2022-05-07 NOTE — Telephone Encounter (Signed)
Pt was a no show 05/07/2022 for an OV with Dr. Ethelene Hal, I have sent out a no show letter

## 2022-05-28 NOTE — Telephone Encounter (Signed)
1st no show, fee waived, letter sent 

## 2022-08-04 ENCOUNTER — Other Ambulatory Visit: Payer: Self-pay | Admitting: Family Medicine

## 2022-08-04 DIAGNOSIS — E876 Hypokalemia: Secondary | ICD-10-CM

## 2022-08-04 DIAGNOSIS — I10 Essential (primary) hypertension: Secondary | ICD-10-CM

## 2022-11-02 ENCOUNTER — Other Ambulatory Visit: Payer: Self-pay | Admitting: Family Medicine

## 2022-11-02 DIAGNOSIS — I10 Essential (primary) hypertension: Secondary | ICD-10-CM

## 2022-11-22 ENCOUNTER — Encounter: Payer: Self-pay | Admitting: Family Medicine

## 2022-11-22 ENCOUNTER — Ambulatory Visit (INDEPENDENT_AMBULATORY_CARE_PROVIDER_SITE_OTHER): Payer: BC Managed Care – PPO | Admitting: Family Medicine

## 2022-11-22 VITALS — BP 126/72 | HR 77 | Temp 97.1°F | Ht 73.0 in | Wt 196.4 lb

## 2022-11-22 DIAGNOSIS — I10 Essential (primary) hypertension: Secondary | ICD-10-CM | POA: Diagnosis not present

## 2022-11-22 DIAGNOSIS — L639 Alopecia areata, unspecified: Secondary | ICD-10-CM

## 2022-11-22 DIAGNOSIS — E876 Hypokalemia: Secondary | ICD-10-CM

## 2022-11-22 DIAGNOSIS — F439 Reaction to severe stress, unspecified: Secondary | ICD-10-CM

## 2022-11-22 DIAGNOSIS — L920 Granuloma annulare: Secondary | ICD-10-CM

## 2022-11-22 DIAGNOSIS — F5102 Adjustment insomnia: Secondary | ICD-10-CM

## 2022-11-22 DIAGNOSIS — F341 Dysthymic disorder: Secondary | ICD-10-CM

## 2022-11-22 LAB — BASIC METABOLIC PANEL
BUN: 12 mg/dL (ref 6–23)
CO2: 34 mEq/L — ABNORMAL HIGH (ref 19–32)
Calcium: 9.2 mg/dL (ref 8.4–10.5)
Chloride: 95 mEq/L — ABNORMAL LOW (ref 96–112)
Creatinine, Ser: 0.85 mg/dL (ref 0.40–1.50)
GFR: 100.88 mL/min (ref >60.00)
Glucose, Bld: 76 mg/dL (ref 70–99)
Potassium: 3.5 mEq/L (ref 3.5–5.1)
Sodium: 137 mEq/L (ref 135–145)

## 2022-11-22 MED ORDER — TRAZODONE HCL 50 MG PO TABS: 25.0000 mg | ORAL_TABLET | Freq: Every evening | ORAL | 0 refills | Status: DC | PRN

## 2022-11-22 MED ORDER — ESCITALOPRAM OXALATE 20 MG PO TABS: 20.0000 mg | ORAL_TABLET | Freq: Every day | ORAL | 1 refills | Status: DC

## 2022-11-22 NOTE — Progress Notes (Signed)
Established Patient Office Visit   Subjective:  Patient ID: Connor Stone, male    DOB: [birth year]  Age: 52 y.o. MRN: [id number]  Chief Complaint  Patient presents with   Follow-up    Follow up on BP irregular at home up and down readings. Concerns about possible alopecia spots on hands have spread more.     HPI Encounter Diagnoses  Name Primary?   Hypokalemia Yes   Essential hypertension    Alopecia areata    Granuloma annulare    Stress at home    Adjustment insomnia    Dysthymia    For follow-up of above.  Stress levels are greatly increased with ask in a strange daughter.  Blood pressures at home have been been elevated at times up into the 300T systolically.  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  Alopecia seems to be advancing.  He is not sleeping well.  After dissipation with alcohol he developed a rapid heartbeat.  This has since resolved.  Since this time he has rarely consumed alcohol and when he got his it is strictly only 1 beer.  He is experiencing no further limitations.   Review of Systems  Constitutional: Negative.   HENT: Negative.    Eyes:  Negative for blurred vision, discharge and redness.  Respiratory: Negative.    Cardiovascular: Negative.   Gastrointestinal:  Negative for abdominal pain.  Genitourinary: Negative.   Musculoskeletal: Negative.  Negative for myalgias.  Skin:  Negative for rash.  Neurological:  Negative for tingling, loss of consciousness and weakness.  Endo/Heme/Allergies:  Negative for polydipsia.  Psychiatric/Behavioral:  The patient is nervous/anxious and has insomnia.       11/22/2022    1:09 PM 03/22/2022    8:43 AM 11/02/2021    3:03 PM  Depression screen PHQ 2/9  Decreased Interest 0 0 0  Down, Depressed, Hopeless 1 0 0  PHQ - 2 Score 1 0 0       Current Outpatient Medications:    amLODipine (NORVASC) 5 MG tablet, TAKE 1 TABLET (5 MG TOTAL) BY MOUTH DAILY., Disp: 90 tablet, Rfl: 1   chlorthalidone (HYGROTON) 25  MG tablet, TAKE 1 TABLET (25 MG TOTAL) BY MOUTH DAILY., Disp: 90 tablet, Rfl: 1   escitalopram (LEXAPRO) 20 MG tablet, Take 1 tablet (20 mg total) by mouth daily., Disp: 30 tablet, Rfl: 1   KLOR-CON M20 20 MEQ tablet, TAKE 1 TABLET BY MOUTH EVERY DAY, Disp: 90 tablet, Rfl: 1   methocarbamol (ROBAXIN) 500 MG tablet, Take 1 tablet (500 mg total) by mouth every 8 (eight) hours as needed for muscle spasms., Disp: 30 tablet, Rfl: 0   traZODone (DESYREL) 50 MG tablet, Take 0.5-1 tablets (25-50 mg total) by mouth at bedtime as needed for sleep., Disp: 30 tablet, Rfl: 0   clobetasol (TEMOVATE) 0.05 % external solution, Apply 1 application. topically daily. (Patient not taking: Reported on 11/22/2022), Disp: , Rfl:    triamcinolone ointment (KENALOG) 0.5 %, May apply a thin strip to rash on left hand and right lower leg daily. (Patient not taking: Reported on 11/22/2022), Disp: 30 g, Rfl: 0   Objective:     BP 126/72 (BP Location: Right Arm, Patient Position: Sitting, Cuff Size: Large)   Pulse 77   Temp (!) 97.1 F (36.2 C) (Temporal)   Ht '6\' 1"'$  (1.854 m)   Wt 196 lb 6.4 oz (89.1 kg)   SpO2 96%   BMI 25.91 kg/m  Physical Exam Constitutional:      General: He is not in acute distress.    Appearance: Normal appearance. He is not ill-appearing, toxic-appearing or diaphoretic.  HENT:     Head: Normocephalic and atraumatic.     Right Ear: External ear normal.     Left Ear: External ear normal.  Eyes:     General: No scleral icterus.       Right eye: No discharge.        Left eye: No discharge.     Extraocular Movements: Extraocular movements intact.     Conjunctiva/sclera: Conjunctivae normal.  Cardiovascular:     Rate and Rhythm: Normal rate and regular rhythm.  Pulmonary:     Effort: Pulmonary effort is normal. No respiratory distress.     Breath sounds: Normal breath sounds.  Skin:    General: Skin is warm and dry.  Neurological:     Mental Status: He is alert and oriented to person,  place, and time.  Psychiatric:        Mood and Affect: Mood normal.        Behavior: Behavior normal.      No results found for any visits on 11/22/22.    The 10-year ASCVD risk score (Arnett DK, et al., 2019) is: 3.7%    Assessment & Plan:   Hypokalemia -     Basic metabolic panel  Essential hypertension -     Basic metabolic panel  Alopecia areata -     Ambulatory referral to Dermatology  Granuloma annulare -     Ambulatory referral to Dermatology  Stress at home -     Escitalopram Oxalate; Take 1 tablet (20 mg total) by mouth daily.  Dispense: 30 tablet; Refill: 1  Adjustment insomnia -     Escitalopram Oxalate; Take 1 tablet (20 mg total) by mouth daily.  Dispense: 30 tablet; Refill: 1 -     traZODone HCl; Take 0.5-1 tablets (25-50 mg total) by mouth at bedtime as needed for sleep.  Dispense: 30 tablet; Refill: 0  Dysthymia -     Escitalopram Oxalate; Take 1 tablet (20 mg total) by mouth daily.  Dispense: 30 tablet; Refill: 1    Return in about 6 weeks (around 01/03/2023).  Stop Paxil.  Start Lexapro.  Trazodone nightly as needed. 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  Continue amlodipine and chlorthalidone.  Rechecking potassium.  Libby Maw, MD

## 2022-11-23 ENCOUNTER — Encounter: Payer: Self-pay | Admitting: Family Medicine

## 2022-11-26 ENCOUNTER — Ambulatory Visit (INDEPENDENT_AMBULATORY_CARE_PROVIDER_SITE_OTHER): Payer: BC Managed Care – PPO | Admitting: Family Medicine

## 2022-11-26 ENCOUNTER — Encounter: Payer: Self-pay | Admitting: Family Medicine

## 2022-11-26 ENCOUNTER — Telehealth: Payer: Self-pay | Admitting: Family Medicine

## 2022-11-26 VITALS — BP 138/80 | HR 78 | Temp 97.0°F | Ht 73.0 in | Wt 199.6 lb

## 2022-11-26 DIAGNOSIS — Z1159 Encounter for screening for other viral diseases: Secondary | ICD-10-CM

## 2022-11-26 DIAGNOSIS — Z114 Encounter for screening for human immunodeficiency virus [HIV]: Secondary | ICD-10-CM

## 2022-11-26 DIAGNOSIS — R300 Dysuria: Secondary | ICD-10-CM

## 2022-11-26 LAB — POCT URINALYSIS DIPSTICK
Glucose, UA: NEGATIVE
Ketones, UA: NEGATIVE
Nitrite, UA: POSITIVE
Protein, UA: POSITIVE — AB
Spec Grav, UA: 1.01 (ref 1.010–1.025)
Urobilinogen, UA: 4 E.U./dL — AB
pH, UA: 7 (ref 5.0–8.0)

## 2022-11-26 LAB — URINALYSIS, ROUTINE W REFLEX MICROSCOPIC
Bilirubin Urine: NEGATIVE
Ketones, ur: NEGATIVE
Specific Gravity, Urine: 1.015 (ref 1.000–1.030)
Urine Glucose: 250 — AB
pH: 6.5 (ref 5.0–8.0)

## 2022-11-26 LAB — CBC
HCT: 43.2 % (ref 39.0–52.0)
Hemoglobin: 15.2 g/dL (ref 13.0–17.0)
MCHC: 35.1 g/dL (ref 30.0–36.0)
MCV: 90.7 fl (ref 78.0–100.0)
Platelets: 235 10*3/uL (ref 150.0–400.0)
RBC: 4.77 Mil/uL (ref 4.22–5.81)
RDW: 13.1 % (ref 11.5–15.5)
WBC: 17.7 10*3/uL — ABNORMAL HIGH (ref 4.0–10.5)

## 2022-11-26 MED ORDER — DOXYCYCLINE HYCLATE 100 MG PO TABS: 100.0000 mg | ORAL_TABLET | Freq: Two times a day (BID) | ORAL | 0 refills | Status: DC

## 2022-11-26 NOTE — Progress Notes (Signed)
Established Patient Office Visit   Subjective:  Patient ID: Connor Stone, male    DOB: [birth year]  Age: 52 y.o. MRN: [id number]  Chief Complaint  Patient presents with   Urinary Tract Infection    Dysuria, urine urgency symptoms x 2 days. Taking AZO x 1 day.     Urinary Tract Infection  Associated symptoms include frequency and urgency. Pertinent negatives include no flank pain, hematuria, nausea or vomiting.   Encounter Diagnoses  Name Primary?   Dysuria Yes   Encounter for hepatitis C screening test for low risk patient    Screening for HIV (human immunodeficiency virus)    Due to a history of dysuria frequency, whitish discharge, urgency and decreased force of stream.  No new sexual partners and not recently active.  Last encounter was months ago.  He did take Azo for symptom relief.   Review of Systems  Constitutional: Negative.   HENT: Negative.    Eyes:  Negative for blurred vision, discharge and redness.  Respiratory: Negative.    Cardiovascular: Negative.   Gastrointestinal:  Negative for abdominal pain, nausea and vomiting.  Genitourinary:  Positive for dysuria, frequency and urgency. Negative for flank pain and hematuria.  Musculoskeletal: Negative.  Negative for joint pain and myalgias.  Skin:  Negative for rash.  Neurological:  Negative for tingling, loss of consciousness and weakness.  Endo/Heme/Allergies:  Negative for polydipsia.     Current Outpatient Medications:    amLODipine (NORVASC) 5 MG tablet, TAKE 1 TABLET (5 MG TOTAL) BY MOUTH DAILY., Disp: 90 tablet, Rfl: 1   chlorthalidone (HYGROTON) 25 MG tablet, TAKE 1 TABLET (25 MG TOTAL) BY MOUTH DAILY., Disp: 90 tablet, Rfl: 1   doxycycline (VIBRA-TABS) 100 MG tablet, Take 1 tablet (100 mg total) by mouth 2 (two) times daily for 10 days., Disp: 20 tablet, Rfl: 0   escitalopram (LEXAPRO) 20 MG tablet, Take 1 tablet (20 mg total) by mouth daily., Disp: 30 tablet, Rfl: 1   KLOR-CON M20 20 MEQ tablet, TAKE 1  TABLET BY MOUTH EVERY DAY, Disp: 90 tablet, Rfl: 1   traZODone (DESYREL) 50 MG tablet, Take 0.5-1 tablets (25-50 mg total) by mouth at bedtime as needed for sleep., Disp: 30 tablet, Rfl: 0   clobetasol (TEMOVATE) 0.05 % external solution, Apply 1 application. topically daily. (Patient not taking: Reported on 11/22/2022), Disp: , Rfl:    methocarbamol (ROBAXIN) 500 MG tablet, Take 1 tablet (500 mg total) by mouth every 8 (eight) hours as needed for muscle spasms. (Patient not taking: Reported on 11/26/2022), Disp: 30 tablet, Rfl: 0   triamcinolone ointment (KENALOG) 0.5 %, May apply a thin strip to rash on left hand and right lower leg daily. (Patient not taking: Reported on 11/22/2022), Disp: 30 g, Rfl: 0   Objective:     BP 138/80 (BP Location: Right Arm, Patient Position: Sitting, Cuff Size: Normal)   Pulse 78   Temp (!) 97 F (36.1 C) (Temporal)   Ht '6\' 1"'$  (1.854 m)   Wt 199 lb 9.6 oz (90.5 kg)   SpO2 95%   BMI 26.33 kg/m    Physical Exam Constitutional:      General: He is not in acute distress.    Appearance: Normal appearance. He is not ill-appearing, toxic-appearing or diaphoretic.  HENT:     Head: Normocephalic and atraumatic.     Right Ear: External ear normal.     Left Ear: External ear normal.  Eyes:     General: No  scleral icterus.       Right eye: No discharge.        Left eye: No discharge.     Extraocular Movements: Extraocular movements intact.     Conjunctiva/sclera: Conjunctivae normal.  Cardiovascular:     Rate and Rhythm: Normal rate and regular rhythm.  Pulmonary:     Effort: Pulmonary effort is normal. No respiratory distress.     Breath sounds: Normal breath sounds.  Abdominal:     Tenderness: There is no right CVA tenderness or left CVA tenderness.  Musculoskeletal:     Cervical back: No rigidity or tenderness.  Skin:    General: Skin is warm and dry.  Neurological:     Mental Status: He is alert and oriented to person, place, and time.   Psychiatric:        Mood and Affect: Mood normal.        Behavior: Behavior normal.      Results for orders placed or performed in visit on 11/26/22  POCT Urinalysis Dipstick  Result Value Ref Range   Color, UA orange    Clarity, UA     Glucose, UA Negative Negative   Bilirubin, UA 2+    Ketones, UA neg    Spec Grav, UA 1.010 1.010 - 1.025   Blood, UA 3+    pH, UA 7.0 5.0 - 8.0   Protein, UA Positive (A) Negative   Urobilinogen, UA 4.0 (A) 0.2 or 1.0 E.U./dL   Nitrite, UA pos    Leukocytes, UA Large (3+) (A) Negative   Appearance     Odor        The 10-year ASCVD risk score (Arnett DK, et al., 2019) is: 4.3%    Assessment & Plan:   Dysuria -     POCT urinalysis dipstick -     CBC -     Urinalysis, Routine w reflex microscopic -     Urine Culture -     Urine cytology ancillary only -     Doxycycline Hyclate; Take 1 tablet (100 mg total) by mouth 2 (two) times daily for 10 days.  Dispense: 20 tablet; Refill: 0  Encounter for hepatitis C screening test for low risk patient -     Hepatitis C antibody  Screening for HIV (human immunodeficiency virus) -     HIV Antibody (routine testing w rflx)    Return if symptoms worsen or fail to improve.  Prostatitis?   Libby Maw, MD

## 2022-11-28 ENCOUNTER — Encounter: Payer: Self-pay | Admitting: Family Medicine

## 2022-11-28 NOTE — Telephone Encounter (Signed)
error 

## 2022-11-28 NOTE — Telephone Encounter (Signed)
Patient notified VIA and scheduled for f/u with provider. Dm/cma

## 2022-11-29 ENCOUNTER — Encounter: Payer: Self-pay | Admitting: Family Medicine

## 2022-11-29 ENCOUNTER — Ambulatory Visit (INDEPENDENT_AMBULATORY_CARE_PROVIDER_SITE_OTHER): Payer: BC Managed Care – PPO | Admitting: Family Medicine

## 2022-11-29 ENCOUNTER — Other Ambulatory Visit (HOSPITAL_COMMUNITY)
Admission: RE | Admit: 2022-11-29 | Discharge: 2022-11-29 | Disposition: A | Discharge status: Home / Self Care | Payer: BC Managed Care – PPO | Source: Ambulatory Visit | Attending: Family Medicine | Admitting: Family Medicine

## 2022-11-29 VITALS — BP 132/80 | HR 73 | Temp 98.1°F | Wt 195.6 lb

## 2022-11-29 DIAGNOSIS — R300 Dysuria: Secondary | ICD-10-CM

## 2022-11-29 DIAGNOSIS — I10 Essential (primary) hypertension: Secondary | ICD-10-CM

## 2022-11-29 DIAGNOSIS — R81 Glycosuria: Secondary | ICD-10-CM

## 2022-11-29 LAB — URINALYSIS, ROUTINE W REFLEX MICROSCOPIC
Bilirubin Urine: NEGATIVE
Hgb urine dipstick: NEGATIVE
Ketones, ur: NEGATIVE
Leukocytes,Ua: NEGATIVE
Nitrite: NEGATIVE
Specific Gravity, Urine: 1.02 (ref 1.000–1.030)
Total Protein, Urine: NEGATIVE
Urine Glucose: NEGATIVE
Urobilinogen, UA: 0.2 (ref 0.0–1.0)
pH: 8 (ref 5.0–8.0)

## 2022-11-29 LAB — CBC WITH DIFFERENTIAL/PLATELET
Basophils Absolute: 0.1 10*3/uL (ref 0.0–0.1)
Basophils Relative: 0.5 % (ref 0.0–3.0)
Eosinophils Absolute: 0 10*3/uL (ref 0.0–0.7)
Eosinophils Relative: 0.5 % (ref 0.0–5.0)
HCT: 43.2 % (ref 39.0–52.0)
Hemoglobin: 15.3 g/dL (ref 13.0–17.0)
Lymphocytes Relative: 21.7 % (ref 12.0–46.0)
Lymphs Abs: 2.2 10*3/uL (ref 0.7–4.0)
MCHC: 35.4 g/dL (ref 30.0–36.0)
MCV: 90.5 fl (ref 78.0–100.0)
Monocytes Absolute: 0.6 10*3/uL (ref 0.1–1.0)
Monocytes Relative: 6 % (ref 3.0–12.0)
Neutro Abs: 7.3 10*3/uL (ref 1.4–7.7)
Neutrophils Relative %: 71.3 % (ref 43.0–77.0)
Platelets: 266 10*3/uL (ref 150.0–400.0)
RBC: 4.77 Mil/uL (ref 4.22–5.81)
RDW: 13.2 % (ref 11.5–15.5)
WBC: 10.2 10*3/uL (ref 4.0–10.5)

## 2022-11-29 LAB — BASIC METABOLIC PANEL
BUN: 11 mg/dL (ref 6–23)
CO2: 32 mEq/L (ref 19–32)
Calcium: 9.6 mg/dL (ref 8.4–10.5)
Chloride: 96 mEq/L (ref 96–112)
Creatinine, Ser: 0.76 mg/dL (ref 0.40–1.50)
GFR: 104.34 mL/min (ref >60.00)
Glucose, Bld: 87 mg/dL (ref 70–99)
Potassium: 3.6 mEq/L (ref 3.5–5.1)
Sodium: 137 mEq/L (ref 135–145)

## 2022-11-29 LAB — PSA: PSA: 5.14 ng/mL — ABNORMAL HIGH (ref 0.10–4.00)

## 2022-11-29 LAB — HEMOGLOBIN A1C: Hgb A1c MFr Bld: 5.2 % (ref 4.6–6.5)

## 2022-11-29 NOTE — Progress Notes (Signed)
Established Patient Office Visit   Subjective:  Patient ID: Connor Stone, male    DOB: 1971/04/29  Age: 52 y.o. MRN: [id number]  Chief Complaint  Patient presents with   Follow-up    FU from lab work    HPI Encounter Diagnoses  Name Primary?   Essential hypertension Yes   Dysuria    Glucosuria    For follow-up of above.  Symptoms are improving.  Dysuria is mostly resolved.  He has been afebrile.  Urine culture and cytology are pending.  Testing for hep C and HIV negative.  He is concerned about the results of his urinalysis and to be checked for diabetes.  We just found out that the lab never received his urine for the culture and cytology testing.   Review of Systems  Constitutional:  Positive for malaise/fatigue. Negative for chills and fever.  HENT: Negative.    Eyes:  Negative for blurred vision, discharge and redness.  Respiratory: Negative.    Cardiovascular: Negative.   Gastrointestinal:  Negative for abdominal pain.  Genitourinary: Negative.  Negative for dysuria and frequency.  Musculoskeletal: Negative.  Negative for myalgias.  Skin:  Negative for rash.  Neurological:  Negative for tingling, loss of consciousness and weakness.  Endo/Heme/Allergies:  Negative for polydipsia.     Current Outpatient Medications:    amLODipine (NORVASC) 5 MG tablet, TAKE 1 TABLET (5 MG TOTAL) BY MOUTH DAILY., Disp: 90 tablet, Rfl: 1   chlorthalidone (HYGROTON) 25 MG tablet, TAKE 1 TABLET (25 MG TOTAL) BY MOUTH DAILY., Disp: 90 tablet, Rfl: 1   doxycycline (VIBRA-TABS) 100 MG tablet, Take 1 tablet (100 mg total) by mouth 2 (two) times daily for 10 days., Disp: 20 tablet, Rfl: 0   escitalopram (LEXAPRO) 20 MG tablet, Take 1 tablet (20 mg total) by mouth daily., Disp: 30 tablet, Rfl: 1   KLOR-CON M20 20 MEQ tablet, TAKE 1 TABLET BY MOUTH EVERY DAY, Disp: 90 tablet, Rfl: 1   methocarbamol (ROBAXIN) 500 MG tablet, Take 1 tablet (500 mg total) by mouth every 8 (eight) hours as needed for  muscle spasms., Disp: 30 tablet, Rfl: 0   traZODone (DESYREL) 50 MG tablet, Take 0.5-1 tablets (25-50 mg total) by mouth at bedtime as needed for sleep., Disp: 30 tablet, Rfl: 0   Objective:     BP 132/80 (BP Location: Right Arm)   Pulse 73   Temp 98.1 F (36.7 C) (Tympanic)   Wt 195 lb 9.6 oz (88.7 kg)   SpO2 97%   BMI 25.81 kg/m    Physical Exam Constitutional:      General: He is not in acute distress.    Appearance: Normal appearance. He is not ill-appearing, toxic-appearing or diaphoretic.  HENT:     Head: Normocephalic and atraumatic.     Right Ear: External ear normal.     Left Ear: External ear normal.  Eyes:     General: No scleral icterus.       Right eye: No discharge.        Left eye: No discharge.     Extraocular Movements: Extraocular movements intact.     Conjunctiva/sclera: Conjunctivae normal.  Pulmonary:     Effort: Pulmonary effort is normal. No respiratory distress.  Skin:    General: Skin is warm and dry.  Neurological:     Mental Status: He is alert and oriented to person, place, and time.  Psychiatric:        Mood and Affect: Mood normal.  Behavior: Behavior normal.      No results found for any visits on 11/29/22.    The 10-year ASCVD risk score (Arnett DK, et al., 2019) is: 4%    Assessment & Plan:   Essential hypertension -     Basic metabolic panel  Dysuria -     Urinalysis, Routine w reflex microscopic -     PSA -     CBC with Differential/Platelet -     Urine cytology ancillary only -     Urine Culture  Glucosuria -     Hemoglobin A1c    Return in about 1 week (around 12/06/2022).  Lab did not receive urine culture and cytology.  Will repeat testing.  Continue doxycycline.  Libby Maw, MD

## 2022-11-30 LAB — URINE CULTURE
MICRO NUMBER:: 14505687
Result:: NO GROWTH
SPECIMEN QUALITY:: ADEQUATE

## 2022-11-30 LAB — HEPATITIS C ANTIBODY: Hepatitis C Ab: NONREACTIVE

## 2022-11-30 LAB — HIV ANTIBODY (ROUTINE TESTING W REFLEX): HIV 1&2 Ab, 4th Generation: NONREACTIVE

## 2022-12-06 ENCOUNTER — Encounter: Payer: Self-pay | Admitting: Family Medicine

## 2022-12-06 ENCOUNTER — Ambulatory Visit: Payer: BC Managed Care – PPO | Admitting: Family Medicine

## 2022-12-06 VITALS — BP 122/72 | HR 69 | Temp 97.9°F | Ht 73.0 in | Wt 194.6 lb

## 2022-12-06 DIAGNOSIS — F439 Reaction to severe stress, unspecified: Secondary | ICD-10-CM | POA: Diagnosis not present

## 2022-12-06 DIAGNOSIS — F5102 Adjustment insomnia: Secondary | ICD-10-CM | POA: Diagnosis not present

## 2022-12-06 DIAGNOSIS — F341 Dysthymic disorder: Secondary | ICD-10-CM | POA: Diagnosis not present

## 2022-12-06 DIAGNOSIS — R972 Elevated prostate specific antigen [PSA]: Secondary | ICD-10-CM | POA: Diagnosis not present

## 2022-12-06 MED ORDER — ESCITALOPRAM OXALATE 20 MG PO TABS: 20.0000 mg | ORAL_TABLET | Freq: Every day | ORAL | 1 refills | Status: DC

## 2022-12-06 NOTE — Progress Notes (Signed)
Established Patient Office Visit   Subjective:  Patient ID: Connor Stone, male    DOB: Aug 23, 1971  Age: 52 y.o. MRN: [id number]  Chief Complaint  Patient presents with   Follow-up    1 week follow up on labs urinary symptoms have improved.     HPI Encounter Diagnoses  Name Primary?   Elevated PSA Yes   Dysthymia    Adjustment insomnia    Stress at home    For follow-up of dysuria, dysthymia and insomnia.  Dysuria  has resolved.  Urine flow is much improved.  Mood is responding to the Lexapro.  He has experienced headaches with the higher dose of Lexapro but they have since resolved.  Feeling calmer and more at ease.  Blood pressure has come down significantly.   Review of Systems  Constitutional: Negative.   HENT: Negative.    Eyes:  Negative for blurred vision, discharge and redness.  Respiratory: Negative.    Cardiovascular: Negative.   Gastrointestinal:  Negative for abdominal pain.  Genitourinary: Negative.   Musculoskeletal: Negative.  Negative for myalgias.  Skin:  Negative for rash.  Neurological:  Negative for tingling, loss of consciousness and weakness.  Endo/Heme/Allergies:  Negative for polydipsia.     Current Outpatient Medications:    amLODipine (NORVASC) 5 MG tablet, TAKE 1 TABLET (5 MG TOTAL) BY MOUTH DAILY., Disp: 90 tablet, Rfl: 1   chlorthalidone (HYGROTON) 25 MG tablet, TAKE 1 TABLET (25 MG TOTAL) BY MOUTH DAILY., Disp: 90 tablet, Rfl: 1   KLOR-CON M20 20 MEQ tablet, TAKE 1 TABLET BY MOUTH EVERY DAY, Disp: 90 tablet, Rfl: 1   methocarbamol (ROBAXIN) 500 MG tablet, Take 1 tablet (500 mg total) by mouth every 8 (eight) hours as needed for muscle spasms., Disp: 30 tablet, Rfl: 0   traZODone (DESYREL) 50 MG tablet, Take 0.5-1 tablets (25-50 mg total) by mouth at bedtime as needed for sleep., Disp: 30 tablet, Rfl: 0   escitalopram (LEXAPRO) 20 MG tablet, Take 1 tablet (20 mg total) by mouth daily., Disp: 90 tablet, Rfl: 1   Objective:     BP 122/72  (BP Location: Right Arm, Patient Position: Sitting, Cuff Size: Normal)   Pulse 69   Temp 97.9 F (36.6 C) (Temporal)   Ht '6\' 1"'$  (1.854 m)   Wt 194 lb 9.6 oz (88.3 kg)   SpO2 96%   BMI 25.67 kg/m    Physical Exam Constitutional:      General: He is not in acute distress.    Appearance: Normal appearance. He is not ill-appearing, toxic-appearing or diaphoretic.  HENT:     Head: Normocephalic and atraumatic.     Right Ear: External ear normal.     Left Ear: External ear normal.  Eyes:     General: No scleral icterus.       Right eye: No discharge.        Left eye: No discharge.     Extraocular Movements: Extraocular movements intact.     Conjunctiva/sclera: Conjunctivae normal.  Pulmonary:     Effort: Pulmonary effort is normal. No respiratory distress.  Skin:    General: Skin is warm and dry.  Neurological:     Mental Status: He is alert and oriented to person, place, and time.  Psychiatric:        Mood and Affect: Mood normal.        Behavior: Behavior normal.      No results found for any visits on 12/06/22.  The 10-year ASCVD risk score (Arnett DK, et al., 2019) is: 3.5%    Assessment & Plan:   Elevated PSA -     PSA  Dysthymia -     Escitalopram Oxalate; Take 1 tablet (20 mg total) by mouth daily.  Dispense: 90 tablet; Refill: 1  Adjustment insomnia -     Escitalopram Oxalate; Take 1 tablet (20 mg total) by mouth daily.  Dispense: 90 tablet; Refill: 1  Stress at home -     Escitalopram Oxalate; Take 1 tablet (20 mg total) by mouth daily.  Dispense: 90 tablet; Refill: 1    Return in about 3 months (around 03/06/2023), or if symptoms worsen or fail to improve.  Complete course of Doxy.  Continue with Lexapro 20 and trazodone as needed.  Verifying elevated PSA today.  Recheck PSA in 3 months.  Libby Maw, MD

## 2022-12-07 LAB — PSA: PSA: 3.44 ng/mL (ref 0.10–4.00)

## 2022-12-10 LAB — URINE CYTOLOGY ANCILLARY ONLY
Chlamydia: NEGATIVE
Comment: NEGATIVE
Comment: NORMAL
Neisseria Gonorrhea: NEGATIVE

## 2022-12-14 ENCOUNTER — Other Ambulatory Visit: Payer: Self-pay | Admitting: Family Medicine

## 2022-12-14 ENCOUNTER — Encounter: Payer: Self-pay | Admitting: Family Medicine

## 2022-12-14 ENCOUNTER — Ambulatory Visit: Payer: BC Managed Care – PPO | Admitting: Family Medicine

## 2022-12-14 ENCOUNTER — Telehealth: Payer: Self-pay | Admitting: Family Medicine

## 2022-12-14 VITALS — BP 124/68 | HR 71 | Temp 97.6°F | Ht 73.0 in | Wt 198.6 lb

## 2022-12-14 DIAGNOSIS — F5102 Adjustment insomnia: Secondary | ICD-10-CM

## 2022-12-14 DIAGNOSIS — R300 Dysuria: Secondary | ICD-10-CM | POA: Diagnosis not present

## 2022-12-14 LAB — POCT URINALYSIS DIPSTICK
Bilirubin, UA: NEGATIVE
Blood, UA: NEGATIVE
Glucose, UA: NEGATIVE
Ketones, UA: NEGATIVE
Nitrite, UA: NEGATIVE
Protein, UA: NEGATIVE
Spec Grav, UA: 1.01 (ref 1.010–1.025)
Urobilinogen, UA: 0.2 E.U./dL
pH, UA: 6.5 (ref 5.0–8.0)

## 2022-12-14 MED ORDER — CIPROFLOXACIN HCL 500 MG PO TABS: 500.0000 mg | ORAL_TABLET | Freq: Two times a day (BID) | ORAL | 0 refills | Status: AC

## 2022-12-14 NOTE — Progress Notes (Signed)
[facility] [address] [address] Dept: [phone] Dept Fax: [phone]  Office Visit  Subjective:    Patient ID: Connor Stone, male    DOB: February 07, 1971, 52 y.o..   MRN: [id number]  Chief Complaint  Patient presents with   Urinary Tract Infection    C/o having burning with urination x 3 days.     History of Present Illness:  Patient is in today with a recurrent bout of dysuria. Mr. Wickersham was seen by Dr. Ethelene Hal on 11/26/2022 with similar symptoms. He was found to have pyuria. His GC/Chlamydia were negative. He had a mildly elevated PSA. He was treated with a course of doxycycline. His symptoms improved. He had a follow-up urine culture which was negative. His PSA had also come back into the normal range. He is back in today with a recurrence of the dysuria. He notes that he has had some white penile discharge. He also has had some discomfort in his perineal area.   Past Medical History: Patient Active Problem List   Diagnosis Date Noted   Elevated PSA 12/06/2022   Dysthymia 11/22/2022   Adjustment insomnia 11/22/2022   Strain of lumbar region 03/22/2022   Venous insufficiency 03/22/2022   Granuloma annulare 03/22/2022   Stress at home 09/14/2021   Stress at work 09/14/2021   New daily persistent headache 09/14/2021   Alopecia areata 09/14/2021   Alcohol use 06/14/2020   Hypokalemia 06/14/2020   Essential hypertension 03/03/2019   S/P laparoscopic colectomy 09/05/2016   Diverticulitis of large intestine with abscess without bleeding 03/05/2016   Pericolonic abscess due to diverticulitis    Change in bowel habits 05/24/2015   BRCA positive 05/24/2015   Healthcare maintenance 05/10/2015   Family history of malignant neoplasm of breast 03/21/2015   Family history of BRCA1 gene positive 03/21/2015   Family history of prostate cancer 03/21/2015   Family history of BRCA gene positive 02/15/2015    Nephrolithiasis 02/15/2015   Past Surgical History:  Procedure Laterality Date   KNEE ARTHROSCOPY W/ ACL RECONSTRUCTION Right    LAPAROSCOPIC LYSIS OF ADHESIONS N/A 09/05/2016   Procedure: LAPAROSCOPIC LYSIS OF ADHESIONS;  Surgeon: Jules Husbands, MD;  Location: [facility] ORS;  Service: General;  Laterality: N/A;   LAPAROSCOPIC PARTIAL COLECTOMY N/A 09/05/2016   Procedure: LAPAROSCOPIC PARTIAL COLECTOMY Sigmoid colectomy, splenic flexure takedown Hand Assisted lap sigmoid colectomy;  Surgeon: Jules Husbands, MD;  Location: [facility] ORS;  Service: General;  Laterality: N/A;  Modified lithotomy w Yellow fin stirrups   Family History  Problem Relation Age of Onset   Breast cancer Mother 29       bilateral breast cancer; d. 7; no genetic testing   Prostate cancer Father 65       no genetic testing and is an only child with a mother with cancer   Diabetes Father    Breast cancer Sister 54        BRCA1 positive, sister is Ulmer Erekson (DOB 06/27/70) tested at [facility] with a report date of 01/13/2015. Result is positive for a pathogenic mutation called BRCA1, p.C61G.   Cancer Paternal Grandmother 16       unknown type of rare cancer   Outpatient Medications Prior to Visit  Medication Sig Dispense Refill   amLODipine (NORVASC) 5 MG tablet TAKE 1 TABLET (5 MG TOTAL) BY MOUTH DAILY. 90 tablet 1   chlorthalidone (HYGROTON) 25 MG tablet TAKE 1 TABLET (25 MG TOTAL) BY MOUTH DAILY.  90 tablet 1   escitalopram (LEXAPRO) 20 MG tablet Take 1 tablet (20 mg total) by mouth daily. 90 tablet 1   KLOR-CON M20 20 MEQ tablet TAKE 1 TABLET BY MOUTH EVERY DAY 90 tablet 1   ondansetron (ZOFRAN) 4 MG tablet Take 4 mg by mouth every 8 (eight) hours as needed for nausea or vomiting.     traZODone (DESYREL) 50 MG tablet Take 0.5-1 tablets (25-50 mg total) by mouth at bedtime as needed for sleep. 30 tablet 0   methocarbamol (ROBAXIN) 500 MG tablet Take 1 tablet (500 mg total) by mouth every 8 (eight) hours as needed for muscle spasms.  (Patient not taking: Reported on 12/14/2022) 30 tablet 0   No facility-administered medications prior to visit.   No Known Allergies   Objective:   Today's Vitals   12/14/22 1321  BP: 124/68  Pulse: 71  Temp: 97.6 F (36.4 C)  TempSrc: Temporal  SpO2: 97%  Weight: 198 lb 9.6 oz (90.1 kg)  Height: 6' 1"$  (1.854 m)   Body mass index is 26.2 kg/m.   General: Well developed, well nourished. No acute distress. Rectal: No rectal masses. The prostate feels of normal size. No bogginess, but paitnet does confirm tenderness   with palpation. Psych: Alert and oriented. Normal mood and affect.  Health Maintenance Due  Topic Date Due   COLONOSCOPY (Pts 45-44yr Insurance coverage will need to be confirmed)  08/03/2018   Lab Results:    Component Ref Range & Units 13:33 (12/14/22) 2 wk ago (11/26/22)  Color, UA  yellow orange  Clarity, UA  clear   Glucose, UA Negative Negative Negative  Bilirubin, UA  neg 2+  Ketones, UA  neg neg  Spec Grav, UA 1.010 - 1.025 1.010 1.010  Blood, UA  neg 3+  pH, UA 5.0 - 8.0 6.5 7.0  Protein, UA Negative Negative Positive Abnormal   Urobilinogen, UA 0.2 or 1.0 E.U./dL 0.2    Assessment & Plan:   Problem List Items Addressed This Visit   None Visit Diagnoses     Dysuria- probable acute prostatitis    -  Primary   Exam consistent with prostatitis. Doxycycline may have partially treated. I iwll check a urine culture. I will prescribe a 6-week course of Cipro.   Relevant Medications   ciprofloxacin (CIPRO) 500 MG tablet   Other Relevant Orders   POCT Urinalysis Dipstick (Completed)   Urine Culture       Return for Follow-up as scheduled.   SHaydee Salter MD

## 2022-12-14 NOTE — Telephone Encounter (Signed)
[place] [phone]    Pt is feeling his urinary infection returning. He completed his antibiotic 1 week ago. The symptoms were pretty bad last night. He would like a treatment asap to keep it from getting so painful. He requested I make this high priority.

## 2022-12-14 NOTE — Patient Instructions (Signed)

## 2022-12-14 NOTE — Telephone Encounter (Signed)
Spoke with patient who was on the line scheduling an appointment to be seen

## 2022-12-16 ENCOUNTER — Encounter: Payer: Self-pay | Admitting: Family Medicine

## 2022-12-16 LAB — URINE CULTURE
MICRO NUMBER:: 14576303
SPECIMEN QUALITY:: ADEQUATE

## 2022-12-17 ENCOUNTER — Encounter: Payer: Self-pay | Admitting: Family Medicine

## 2023-01-20 ENCOUNTER — Other Ambulatory Visit: Payer: Self-pay | Admitting: Family Medicine

## 2023-01-20 DIAGNOSIS — E876 Hypokalemia: Secondary | ICD-10-CM

## 2023-01-20 DIAGNOSIS — I10 Essential (primary) hypertension: Secondary | ICD-10-CM

## 2023-03-07 ENCOUNTER — Ambulatory Visit: Payer: BC Managed Care – PPO | Admitting: Family Medicine

## 2023-03-07 VITALS — BP 120/80 | HR 57 | Temp 98.3°F | Ht 73.0 in | Wt 181.2 lb

## 2023-03-07 DIAGNOSIS — F439 Reaction to severe stress, unspecified: Secondary | ICD-10-CM | POA: Diagnosis not present

## 2023-03-07 DIAGNOSIS — Z23 Encounter for immunization: Secondary | ICD-10-CM | POA: Diagnosis not present

## 2023-03-07 DIAGNOSIS — F341 Dysthymic disorder: Secondary | ICD-10-CM

## 2023-03-07 DIAGNOSIS — F5102 Adjustment insomnia: Secondary | ICD-10-CM | POA: Diagnosis not present

## 2023-03-07 DIAGNOSIS — I10 Essential (primary) hypertension: Secondary | ICD-10-CM | POA: Diagnosis not present

## 2023-03-07 MED ORDER — ESCITALOPRAM OXALATE 20 MG PO TABS: 20.0000 mg | ORAL_TABLET | Freq: Every day | ORAL | 1 refills | Status: DC

## 2023-03-07 NOTE — Progress Notes (Signed)
Established Patient Office Visit   Subjective:  Patient ID: Connor Stone, male    DOB: December 18, 1970  Age: 52 y.o. MRN: [id number]  Chief Complaint  Patient presents with   3 month follow-up    HPI Encounter Diagnoses  Name Primary?   Essential hypertension Yes   Immunization due    Stress at home    Dysthymia    Adjustment insomnia    Feeling much better.  Exercising and losing weight.  Mood and spirits are elevated.  Has begun immunotherapy for alopecia.   Review of Systems  Constitutional: Negative.   HENT: Negative.    Eyes:  Negative for blurred vision, discharge and redness.  Respiratory: Negative.    Cardiovascular: Negative.   Gastrointestinal:  Negative for abdominal pain.  Genitourinary: Negative.   Musculoskeletal: Negative.  Negative for myalgias.  Skin:  Negative for rash.  Neurological:  Negative for tingling, loss of consciousness and weakness.  Endo/Heme/Allergies:  Negative for polydipsia.      03/07/2023    1:19 PM 12/06/2022    3:39 PM 11/26/2022    8:03 AM  Depression screen PHQ 2/9  Decreased Interest 0 0 0  Down, Depressed, Hopeless 0 0 0  PHQ - 2 Score 0 0 0  Difficult doing work/chores  Not difficult at all Not difficult at all      Current Outpatient Medications:    amLODipine (NORVASC) 5 MG tablet, TAKE 1 TABLET (5 MG TOTAL) BY MOUTH DAILY., Disp: 90 tablet, Rfl: 1   chlorthalidone (HYGROTON) 25 MG tablet, TAKE 1 TABLET BY MOUTH ONCE DAILY, Disp: 90 tablet, Rfl: 1   KLOR-CON M20 20 MEQ tablet, TAKE 1 TABLET BY MOUTH EVERY DAY, Disp: 90 tablet, Rfl: 1   methocarbamol (ROBAXIN) 500 MG tablet, Take 1 tablet (500 mg total) by mouth every 8 (eight) hours as needed for muscle spasms., Disp: 30 tablet, Rfl: 0   OLUMIANT tablet, , Disp: , Rfl:    ondansetron (ZOFRAN) 4 MG tablet, Take 4 mg by mouth every 8 (eight) hours as needed for nausea or vomiting., Disp: , Rfl:    traZODone (DESYREL) 50 MG tablet, TAKE 0.5-1 TABLETS BY MOUTH AT BEDTIME AS  NEEDED FOR SLEEP., Disp: 90 tablet, Rfl: 1   escitalopram (LEXAPRO) 20 MG tablet, Take 1 tablet (20 mg total) by mouth daily., Disp: 90 tablet, Rfl: 1   Objective:     BP 120/80 (BP Location: Right Arm, Patient Position: Sitting)   Pulse (!) 57   Temp 98.3 F (36.8 C) (Temporal)   Ht 6\' 1"  (1.854 m)   Wt 181 lb 3.2 oz (82.2 kg)   SpO2 98%   BMI 23.91 kg/m  BP Readings from Last 3 Encounters:  03/07/23 120/80  12/14/22 124/68  12/06/22 122/72   Wt Readings from Last 3 Encounters:  03/07/23 181 lb 3.2 oz (82.2 kg)  12/14/22 198 lb 9.6 oz (90.1 kg)  12/06/22 194 lb 9.6 oz (88.3 kg)      Physical Exam Constitutional:      General: He is not in acute distress.    Appearance: Normal appearance. He is not ill-appearing, toxic-appearing or diaphoretic.  HENT:     Head: Normocephalic and atraumatic.     Right Ear: External ear normal.     Left Ear: External ear normal.  Eyes:     General: No scleral icterus.       Right eye: No discharge.        Left eye: No  discharge.     Extraocular Movements: Extraocular movements intact.     Conjunctiva/sclera: Conjunctivae normal.  Pulmonary:     Effort: Pulmonary effort is normal. No respiratory distress.  Skin:    General: Skin is warm and dry.  Neurological:     Mental Status: He is alert and oriented to person, place, and time.  Psychiatric:        Mood and Affect: Mood normal.        Behavior: Behavior normal.      No results found for any visits on 03/07/23.    The 10-year ASCVD risk score (Arnett DK, et al., 2019) is: 3.4%    Assessment & Plan:   Essential hypertension  Immunization due -     Varicella-zoster vaccine IM  Stress at home -     Escitalopram Oxalate; Take 1 tablet (20 mg total) by mouth daily.  Dispense: 90 tablet; Refill: 1  Dysthymia -     Escitalopram Oxalate; Take 1 tablet (20 mg total) by mouth daily.  Dispense: 90 tablet; Refill: 1  Adjustment insomnia -     Escitalopram Oxalate; Take 1  tablet (20 mg total) by mouth daily.  Dispense: 90 tablet; Refill: 1    Return in about 6 months (around 09/07/2023), or if symptoms worsen or fail to improve.    Mliss Sax, MD

## 2023-05-02 ENCOUNTER — Other Ambulatory Visit: Payer: Self-pay | Admitting: Family Medicine

## 2023-05-02 DIAGNOSIS — I10 Essential (primary) hypertension: Secondary | ICD-10-CM

## 2023-06-15 ENCOUNTER — Other Ambulatory Visit: Payer: Self-pay | Admitting: Family Medicine

## 2023-06-15 DIAGNOSIS — F5102 Adjustment insomnia: Secondary | ICD-10-CM

## 2023-06-25 ENCOUNTER — Other Ambulatory Visit: Payer: Self-pay | Admitting: Family Medicine

## 2023-06-25 DIAGNOSIS — F439 Reaction to severe stress, unspecified: Secondary | ICD-10-CM

## 2023-06-25 DIAGNOSIS — F341 Dysthymic disorder: Secondary | ICD-10-CM

## 2023-06-25 DIAGNOSIS — I10 Essential (primary) hypertension: Secondary | ICD-10-CM

## 2023-06-25 DIAGNOSIS — E876 Hypokalemia: Secondary | ICD-10-CM

## 2023-06-25 DIAGNOSIS — F5102 Adjustment insomnia: Secondary | ICD-10-CM

## 2023-06-27 ENCOUNTER — Ambulatory Visit (INDEPENDENT_AMBULATORY_CARE_PROVIDER_SITE_OTHER)
Admission: RE | Admit: 2023-06-27 | Discharge: 2023-06-27 | Disposition: A | Discharge status: Home / Self Care | Payer: BC Managed Care – PPO | Source: Ambulatory Visit | Attending: Family Medicine | Admitting: Family Medicine

## 2023-06-27 ENCOUNTER — Encounter: Payer: Self-pay | Admitting: Family Medicine

## 2023-06-27 ENCOUNTER — Ambulatory Visit: Payer: BC Managed Care – PPO | Admitting: Family Medicine

## 2023-06-27 VITALS — BP 124/72 | HR 62 | Temp 98.2°F | Ht 73.0 in | Wt 182.8 lb

## 2023-06-27 DIAGNOSIS — M5432 Sciatica, left side: Secondary | ICD-10-CM

## 2023-06-27 DIAGNOSIS — Z86718 Personal history of other venous thrombosis and embolism: Secondary | ICD-10-CM | POA: Diagnosis not present

## 2023-06-27 DIAGNOSIS — M25552 Pain in left hip: Secondary | ICD-10-CM

## 2023-06-27 MED ORDER — PREDNISONE 10 MG (48) PO TBPK
ORAL_TABLET | ORAL | 0 refills | Status: DC
Start: 2023-06-27 — End: 2023-12-13

## 2023-06-27 MED ORDER — GABAPENTIN 100 MG PO CAPS
ORAL_CAPSULE | ORAL | 0 refills | Status: DC
Start: 2023-06-27 — End: 2023-12-13

## 2023-06-27 NOTE — Progress Notes (Addendum)
Established Patient Office Visit   Subjective:  Patient ID: Connor Stone, male    DOB: 1971/07/19  Age: 52 y.o. MRN: [id number]  Chief Complaint  Patient presents with   Hip Pain    Hip pain x 2+ year. Pain located at Left hip. Sciatic pain along with the hip pain. Pt also has trouble walking at times. Tingling sharp shooting pains.     Hip Pain  Pertinent negatives include no tingling.   Encounter Diagnoses  Name Primary?   Left hip pain Yes   Sciatica of left side    History of DVT (deep vein thrombosis)    Presents with a 2-week history of worsening left groin pain that radiates down his anterior thigh.  No particular injury.  He has been playing a lot of golf this past summer.  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  He played sports through high school and college.  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  Ongoing history of left-sided sciatica.  It has been bothering him as well.  He notes pain radiating from his buttock down the back of his thigh into the lateral side of his left leg.  Denies weakness numbness or tingling.   Review of Systems  Constitutional: Negative.   HENT: Negative.    Eyes:  Negative for blurred vision, discharge and redness.  Respiratory: Negative.    Cardiovascular: Negative.   Gastrointestinal:  Negative for abdominal pain.  Genitourinary: Negative.   Musculoskeletal:  Positive for joint pain. Negative for myalgias.  Skin:  Negative for rash.  Neurological:  Negative for tingling, loss of consciousness and weakness.  Endo/Heme/Allergies:  Negative for polydipsia.     Current Outpatient Medications:    amLODipine (NORVASC) 5 MG tablet, TAKE 1 TABLET (5 MG TOTAL) BY MOUTH DAILY., Disp: 90 tablet, Rfl: 1   chlorthalidone (HYGROTON) 25 MG tablet, TAKE 1 TABLET BY MOUTH EVERY DAY, Disp: 90 tablet, Rfl: 1   escitalopram (LEXAPRO) 20 MG tablet, TAKE 1 TABLET BY MOUTH EVERY DAY, Disp: 90 tablet, Rfl: 1   gabapentin (NEURONTIN) 100 MG  capsule, Take 1 capsule (100 mg total) by mouth at bedtime for 7 days, THEN 1 capsule (100 mg total) 2 (two) times daily for 21 days., Disp: 50 capsule, Rfl: 0   KLOR-CON M20 20 MEQ tablet, TAKE 1 TABLET BY MOUTH EVERY DAY, Disp: 90 tablet, Rfl: 1   OLUMIANT tablet, , Disp: , Rfl:    ondansetron (ZOFRAN) 4 MG tablet, Take 4 mg by mouth every 8 (eight) hours as needed for nausea or vomiting., Disp: , Rfl:    predniSONE (STERAPRED UNI-PAK 48 TAB) 10 MG (48) TBPK tablet, Please instruct in 12-day Dosepak., Disp: 48 tablet, Rfl: 0   traZODone (DESYREL) 50 MG tablet, TAKE 1/2 TO 1 TABLET BY MOUTH AT BEDTIME AS NEEDED FOR SLEEP, Disp: 90 tablet, Rfl: 1   methocarbamol (ROBAXIN) 500 MG tablet, Take 1 tablet (500 mg total) by mouth every 8 (eight) hours as needed for muscle spasms. (Patient not taking: Reported on 06/27/2023), Disp: 30 tablet, Rfl: 0   Objective:     BP 124/72   Pulse 62   Temp 98.2 F (36.8 C)   Ht 6\' 1"  (1.854 m)   Wt 182 lb 12.8 oz (82.9 kg)   SpO2 97%   BMI 24.12 kg/m    Physical Exam Constitutional:      General: He is not in acute distress.  Appearance: Normal appearance. He is not ill-appearing, toxic-appearing or diaphoretic.  HENT:     Head: Normocephalic and atraumatic.     Right Ear: External ear normal.     Left Ear: External ear normal.  Eyes:     General: No scleral icterus.       Right eye: No discharge.        Left eye: No discharge.     Extraocular Movements: Extraocular movements intact.     Conjunctiva/sclera: Conjunctivae normal.  Cardiovascular:     Rate and Rhythm: Normal rate and regular rhythm.  Pulmonary:     Effort: Pulmonary effort is normal. No respiratory distress.     Breath sounds: Normal breath sounds.  Musculoskeletal:     Lumbar back: Tenderness (Left sciatic notch without radiation of pain.) present. No bony tenderness. Normal range of motion. Negative right straight leg raise test and negative left straight leg raise test.      Right hip: No bony tenderness. Normal range of motion. Normal strength.     Left hip: Tenderness present. No bony tenderness. Decreased range of motion. Normal strength.  Skin:    General: Skin is warm and dry.  Neurological:     Mental Status: He is alert and oriented to person, place, and time.  Psychiatric:        Mood and Affect: Mood normal.        Behavior: Behavior normal.      No results found for any visits on 06/27/23.    The ASCVD Risk score (Arnett DK, et al., 2019) failed to calculate for the following reasons:   Cannot find a previous HDL lab   Cannot find a previous total cholesterol lab    Assessment & Plan:   Left hip pain -     DG HIP UNILAT W OR W/O PELVIS 2-3 VIEWS LEFT; Future -     predniSONE; Please instruct in 12-day Dosepak.  Dispense: 48 tablet; Refill: 0 -     Ambulatory referral to Orthopedic Surgery  Sciatica of left side -     DG Lumbar Spine Complete; Future -     predniSONE; Please instruct in 12-day Dosepak.  Dispense: 48 tablet; Refill: 0 -     Gabapentin; Take 1 capsule (100 mg total) by mouth at bedtime for 7 days, THEN 1 capsule (100 mg total) 2 (two) times daily for 21 days.  Dispense: 50 capsule; Refill: 0 -     Ambulatory referral to Orthopedic Surgery  History of DVT (deep vein thrombosis)    Return if symptoms worsen or fail to improve.    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  12-day Dosepak.  Will start Neurontin 100 mg at at bedtime for 7 days and then twice daily as tolerated.  Consider [facility] referral to [facility] in [place] as needed.  Mliss Sax, MD

## 2023-06-28 ENCOUNTER — Encounter: Payer: Self-pay | Admitting: Family Medicine

## 2023-07-02 ENCOUNTER — Ambulatory Visit: Payer: BC Managed Care – PPO | Admitting: Family Medicine

## 2023-07-02 DIAGNOSIS — Z86718 Personal history of other venous thrombosis and embolism: Secondary | ICD-10-CM | POA: Insufficient documentation

## 2023-07-02 NOTE — Addendum Note (Signed)
Addended by: Nadene Rubins A on: 07/02/2023 12:12 PM   Modules accepted: Orders

## 2023-09-22 ENCOUNTER — Other Ambulatory Visit: Payer: Self-pay | Admitting: Family Medicine

## 2023-09-22 DIAGNOSIS — I10 Essential (primary) hypertension: Secondary | ICD-10-CM

## 2023-09-23 ENCOUNTER — Other Ambulatory Visit: Payer: Self-pay

## 2023-09-23 DIAGNOSIS — I10 Essential (primary) hypertension: Secondary | ICD-10-CM

## 2023-09-23 MED ORDER — AMLODIPINE BESYLATE 5 MG PO TABS: 5.0000 mg | ORAL_TABLET | Freq: Every day | ORAL | 0 refills | Status: DC

## 2023-11-29 ENCOUNTER — Other Ambulatory Visit: Payer: Self-pay | Admitting: Family Medicine

## 2023-11-29 DIAGNOSIS — I10 Essential (primary) hypertension: Secondary | ICD-10-CM

## 2023-12-13 ENCOUNTER — Ambulatory Visit: Payer: 59 | Admitting: Nurse Practitioner

## 2023-12-13 ENCOUNTER — Ambulatory Visit: Payer: Self-pay | Admitting: Family Medicine

## 2023-12-13 ENCOUNTER — Encounter: Payer: Self-pay | Admitting: Nurse Practitioner

## 2023-12-13 VITALS — BP 110/70 | HR 62 | Temp 98.8°F | Ht 73.0 in | Wt 183.0 lb

## 2023-12-13 DIAGNOSIS — E876 Hypokalemia: Secondary | ICD-10-CM

## 2023-12-13 DIAGNOSIS — R55 Syncope and collapse: Secondary | ICD-10-CM

## 2023-12-13 DIAGNOSIS — I10 Essential (primary) hypertension: Secondary | ICD-10-CM

## 2023-12-13 LAB — CBC
HCT: 41.7 % (ref 39.0–52.0)
Hemoglobin: 14.4 g/dL (ref 13.0–17.0)
MCHC: 34.6 g/dL (ref 30.0–36.0)
MCV: 92.4 fL (ref 78.0–100.0)
Platelets: 203 10*3/uL (ref 150.0–400.0)
RBC: 4.51 Mil/uL (ref 4.22–5.81)
RDW: 12.8 % (ref 11.5–15.5)
WBC: 3.8 10*3/uL — ABNORMAL LOW (ref 4.0–10.5)

## 2023-12-13 LAB — BASIC METABOLIC PANEL
BUN: 15 mg/dL (ref 6–23)
CO2: 32 meq/L (ref 19–32)
Calcium: 8.6 mg/dL (ref 8.4–10.5)
Chloride: 94 meq/L — ABNORMAL LOW (ref 96–112)
Creatinine, Ser: 0.81 mg/dL (ref 0.40–1.50)
GFR: 101.61 mL/min (ref >60.00)
Glucose, Bld: 89 mg/dL (ref 70–99)
Potassium: 3.4 meq/L — ABNORMAL LOW (ref 3.5–5.1)
Sodium: 134 meq/L — ABNORMAL LOW (ref 135–145)

## 2023-12-13 LAB — POCT URINALYSIS DIPSTICK
Bilirubin, UA: NEGATIVE
Blood, UA: NEGATIVE
Glucose, UA: NEGATIVE
Ketones, UA: NEGATIVE
Leukocytes, UA: NEGATIVE
Nitrite, UA: NEGATIVE
Protein, UA: NEGATIVE
Spec Grav, UA: 1.01 (ref 1.010–1.025)
Urobilinogen, UA: 0.2 U/dL
pH, UA: 7.5 (ref 5.0–8.0)

## 2023-12-13 LAB — TSH: TSH: 3.01 u[IU]/mL (ref 0.35–5.50)

## 2023-12-13 MED ORDER — POTASSIUM CHLORIDE CRYS ER 20 MEQ PO TBCR: 20.0000 meq | EXTENDED_RELEASE_TABLET | Freq: Every day | ORAL | 1 refills | Status: DC

## 2023-12-13 NOTE — Progress Notes (Signed)
Acute Office Visit  Subjective:    Patient ID: Connor Stone, male    DOB: 1971/08/23, 53 y.o.   MRN: [id number]  Chief Complaint  Patient presents with   Dizziness    Dizziness felling this morning, numbness and tingling in forearms, dry mouth, coldness in feet and hands    Patient is in today for near syncope episode while sitting on tiolet this morning to urine- dizziness, tingling in forearms, and diaphoresis. Symptoms resolved after with drinking orange juice and laying down. BP at home 127/76 and pulse 60 after symptoms resolved No ALCOHOL or tobacco use He is a PE teacher, hence stays active. Denies any syncopal episode with exertion. Reports flu like symptoms 1week ago, but now resolved He had syncopal episode 2years ago secondry to skipping meal and low blood sugar.  Outpatient Medications Prior to Visit  Medication Sig   amLODipine (NORVASC) 5 MG tablet TAKE 1 TABLET (5 MG TOTAL) BY MOUTH DAILY.   chlorthalidone (HYGROTON) 25 MG tablet TAKE 1 TABLET BY MOUTH EVERY DAY   escitalopram (LEXAPRO) 20 MG tablet TAKE 1 TABLET BY MOUTH EVERY DAY   KLOR-CON M20 20 MEQ tablet TAKE 1 TABLET BY MOUTH EVERY DAY   OLUMIANT tablet daily.   ondansetron (ZOFRAN) 4 MG tablet Take 4 mg by mouth every 8 (eight) hours as needed for nausea or vomiting.   traZODone (DESYREL) 50 MG tablet TAKE 1/2 TO 1 TABLET BY MOUTH AT BEDTIME AS NEEDED FOR SLEEP   [DISCONTINUED] gabapentin (NEURONTIN) 100 MG capsule Take 1 capsule (100 mg total) by mouth at bedtime for 7 days, THEN 1 capsule (100 mg total) 2 (two) times daily for 21 days.   [DISCONTINUED] methocarbamol (ROBAXIN) 500 MG tablet Take 1 tablet (500 mg total) by mouth every 8 (eight) hours as needed for muscle spasms. (Patient not taking: Reported on 06/27/2023)   [DISCONTINUED] predniSONE (STERAPRED UNI-PAK 48 TAB) 10 MG (48) TBPK tablet Please instruct in 12-day Dosepak.   No facility-administered medications prior to visit.    Reviewed  past medical and social history.  Review of Systems  Constitutional:  Negative for fatigue and fever.  Cardiovascular:  Negative for chest pain and palpitations.  Neurological:  Positive for dizziness. Negative for headaches.   Per HPI     Objective:    Physical Exam Vitals and nursing note reviewed.  Cardiovascular:     Rate and Rhythm: Normal rate and regular rhythm.     Pulses: Normal pulses.     Heart sounds: Normal heart sounds.  Pulmonary:     Effort: Pulmonary effort is normal.     Breath sounds: Normal breath sounds.  Musculoskeletal:     Right lower leg: No edema.     Left lower leg: No edema.  Neurological:     Mental Status: He is alert and oriented to person, place, and time.  Psychiatric:        Mood and Affect: Mood normal.        Behavior: Behavior normal.        Thought Content: Thought content normal.    BP 110/70 (BP Location: Left Arm, Patient Position: Sitting, Cuff Size: Normal)   Pulse 62   Temp 98.8 F (37.1 C)   Ht 6\' 1"  (1.854 m)   Wt 183 lb (83 kg)   SpO2 98%   BMI 24.14 kg/m    Results for orders placed or performed in visit on 12/13/23  POCT urinalysis dipstick  Result Value  Ref Range   Color, UA     Clarity, UA     Glucose, UA Negative Negative   Bilirubin, UA Negative    Ketones, UA Negative    Spec Grav, UA 1.010 1.010 - 1.025   Blood, UA Negative    pH, UA 7.5 5.0 - 8.0   Protein, UA Negative Negative   Urobilinogen, UA 0.2 0.2 or 1.0 E.U./dL   Nitrite, UA Negative    Leukocytes, UA Negative Negative   Appearance     Odor        Assessment & Plan:   Problem List Items Addressed This Visit   None Visit Diagnoses       Vasovagal near-syncope    -  Primary   Relevant Orders   CBC   Basic metabolic panel   TSH   POCT urinalysis dipstick (Completed)     Negative orthostatic BP, no vertigo from supine to sitting or standing. Advised to maintain adequate oral hydration, change positions slowly.  No orders of the  defined types were placed in this encounter.  Return in about 2 weeks (around 12/27/2023) for with Dr. Doreene Burke.    Alysia Penna, NP

## 2023-12-13 NOTE — Telephone Encounter (Signed)
  Chief Complaint: dizziness Symptoms: dizziness, sweating Frequency: Began this morning when getting out of bed Pertinent Negatives: Patient denies SOB, CP, weakness on one side, slurred speech Disposition: [] ED /[] Urgent Care (no appt availability in office) / [x] Appointment(In office/virtual)/ []  [facility]/ [] Home Care/ [] Refused Recommended Disposition /[] [facility]/ []  Follow-up with PCP Additional Notes: Patient calls reporting an episode of dizziness that began when going to the bathroom after waking up today. States he went to the kitchen and ate some food, then laid down on the couch and it began to resolve. States he has had episodes like this in the past when his blood sugar was low. Patient reports he checked BP 127/79, HR 60. Per protocol, patient to be evaluated within 24 hours. First available appointment with PCP 12/16/23. Patient scheduled with first available provider in clinic for today at 1120. Care advice reviewed, patient verbalized understandingand denies further questions at this time. Alerting PCP for review.    Copied from CRM [id number]. Topic: Clinical - Red Word Triage >> Dec 13, 2023  8:05 AM Clayton Bibles wrote: Red Word that prompted transfer to Nurse Triage: Almost past out this morning. It started tingling in left arm and hand. A little dizzy. Just got over being sick from the flu. Reason for Disposition  [1] MODERATE dizziness (e.g., interferes with normal activities) AND [2] has NOT been evaluated by doctor (or NP/PA) for this  (Exception: Dizziness caused by heat exposure, sudden standing, or poor fluid intake.)  Answer Assessment - Initial Assessment Questions 1. DESCRIPTION: "Describe your dizziness."     Was in the bathroom and began to feel dizzy- felt tingling in arms, dizzy. Went to the kitchen and ate food, laid down, it resolved. 2. LIGHTHEADED: "Do you feel lightheaded?" (e.g., somewhat faint, woozy, weak upon standing)     Felt  faint- states he still feels slightly lightheaded. 3. VERTIGO: "Do you feel like either you or the room is spinning or tilting?" (i.e. vertigo)     Denies 4. SEVERITY: "How bad is it?"  "Do you feel like you are going to faint?" "Can you stand and walk?"   - MILD: Feels slightly dizzy, but walking normally.   - MODERATE: Feels unsteady when walking, but not falling; interferes with normal activities (e.g., school, work).   - SEVERE: Unable to walk without falling, or requires assistance to walk without falling; feels like passing out now.      Mild 5. ONSET:  "When did the dizziness begin?"     This morning when getting out of bed and going to the bathroom. 6. AGGRAVATING FACTORS: "Does anything make it worse?" (e.g., standing, change in head position)     Unsure, random event 7. HEART RATE: "Can you tell me your heart rate?" "How many beats in 15 seconds?"  (Note: not all patients can do this)       60 HR 8. CAUSE: "What do you think is causing the dizziness?"     Low blood sugar 9. RECURRENT SYMPTOM: "Have you had dizziness before?" If Yes, ask: "When was the last time?" "What happened that time?"     Yes, when blood sugar gets low 10. OTHER SYMPTOMS: "Do you have any other symptoms?" (e.g., fever, chest pain, vomiting, diarrhea, bleeding)       sweaty  Protocols used: Dizziness - Lightheadedness-A-AH

## 2023-12-13 NOTE — Patient Instructions (Signed)
Go to lab  Syncope, Adult  Syncope is when you pass out or faint for a short time. It is caused by a sudden decrease in blood flow to the brain. This can happen for many reasons. It can sometimes happen when seeing blood, getting a shot (injection), or having pain or strong emotions. Most causes of fainting are not dangerous, but in some cases it can be a sign of a serious medical problem. If you faint, get help right away. Call your local emergency services (911 in the [place] Follow these instructions at home: Watch for any changes in your symptoms. Take these actions to stay safe and help with your symptoms: Knowing when you may be about to faint Signs that you may be about to faint include: Feeling dizzy or light-headed. It may feel like the room is spinning. Feeling weak. Feeling like you may vomit (nauseous). Seeing spots or seeing all white or all black. Having cold, clammy skin. Feeling warm and sweaty. Hearing ringing in the ears. If you start to feel like you might faint, sit or lie down right away. If sitting, lower your head down between your legs. If lying down, raise (elevate) your feet above the level of your heart. Breathe deeply and steadily. Wait until all of the symptoms are gone. Have someone stay with you until you feel better. Medicines Take over-the-counter and prescription medicines only as told by your doctor. If you are taking blood pressure or heart medicine, sit up and stand up slowly. Spend a few minutes getting ready to sit and then stand. This can help you feel less dizzy. Lifestyle Do not drive, use machinery, or play sports until your doctor says it is okay. Do not drink alcohol. Do not smoke or use any products that contain nicotine or tobacco. If you need help quitting, ask your doctor. Avoid hot tubs and saunas. General instructions Talk with your doctor about your symptoms. You may need to have testing to help find the cause. Drink enough fluid to  keep your pee (urine) pale yellow. Avoid standing for a long time. If you must stand for a long time, do movements such as: Moving your legs. Crossing your legs. Flexing and stretching your leg muscles. Squatting. Keep all follow-up visits. Contact a doctor if: You have episodes of near fainting. Get help right away if: You pass out or faint. You hit your head or are injured after fainting. You have any of these symptoms: Fast or uneven heartbeats (palpitations). Pain in your chest, belly, or back. Shortness of breath. You have jerky movements that you cannot control (seizure). You have a very bad headache. You are confused. You have problems with how you see (vision). You are very weak. You have trouble walking. You are bleeding from your mouth or your butt (rectum). You have black or tarry poop (stool). These symptoms may be an emergency. Get help right away. Call your local emergency services (911 in the [place] Do not wait to see if the symptoms will go away. Do not drive yourself to the hospital. Summary Syncope is when you pass out or faint for a short time. It is caused by a sudden decrease in blood flow to the brain. Signs that you may be about to faint include feeling dizzy or light-headed, feeling like you may vomit, seeing all white or all black, or having cold, clammy skin. If you start to feel like you might faint, sit or lie down right away. Lower your head if  sitting, or raise (elevate) your feet if lying down. Breathe deeply and steadily. Wait until all of the symptoms are gone. This information is not intended to replace advice given to you by your health care provider. Make sure you discuss any questions you have with your health care provider. Document Revised: 02/23/2021 Document Reviewed: 02/23/2021 Elsevier Patient Education  2024 [employer].

## 2023-12-17 ENCOUNTER — Other Ambulatory Visit: Payer: Self-pay | Admitting: Family Medicine

## 2023-12-17 DIAGNOSIS — I10 Essential (primary) hypertension: Secondary | ICD-10-CM

## 2023-12-17 DIAGNOSIS — F5102 Adjustment insomnia: Secondary | ICD-10-CM

## 2024-04-11 ENCOUNTER — Other Ambulatory Visit: Payer: Self-pay | Admitting: Family Medicine

## 2024-04-11 DIAGNOSIS — F341 Dysthymic disorder: Secondary | ICD-10-CM

## 2024-04-11 DIAGNOSIS — F439 Reaction to severe stress, unspecified: Secondary | ICD-10-CM

## 2024-04-11 DIAGNOSIS — F5102 Adjustment insomnia: Secondary | ICD-10-CM

## 2024-04-17 MED ORDER — ESCITALOPRAM OXALATE 20 MG PO TABS: 20.0000 mg | ORAL_TABLET | Freq: Every day | ORAL | 0 refills | Status: DC

## 2024-04-17 NOTE — Telephone Encounter (Signed)
 Patient has a appt scheduled for aug 21 he is [place] town at the moment and will  be back in August he is needing the escitalopram  (LEXAPRO ) 20 MG tablet [[id number]]   Order Detai  filled at the [facility] [phone]

## 2024-04-17 NOTE — Addendum Note (Signed)
 Addended by: Catheryn Cluck on: 04/17/2024 04:54 PM   Modules accepted: Orders

## 2024-04-17 NOTE — Addendum Note (Signed)
 Addended by: Anders Katz on: 04/17/2024 03:16 PM   Modules accepted: Orders

## 2024-04-17 NOTE — Telephone Encounter (Signed)
 Med refill request received for escitalopram  (LEXAPRO ) 20 MG tablet, #90, 1RF.   Pt informed appt needed for RF. Pt states he is currently out of town and in need of his medication and should not stop cold-turkey. Pt states he has been filling this medication with no issues.  Called [facility] in [place], [place], script sent 05/2023, filled 08/2023 and 12/2023.  Pt informed request will be sent to Doc of the Day to review and refill if he feels comfortable doing so. If we receive notification from Doc of the Day prior to 5:00pm, the office will notify pt.   Preferred [facility]: [facility] [address] [place], [place] [address] [phone]

## 2024-05-29 ENCOUNTER — Other Ambulatory Visit: Payer: Self-pay | Admitting: Family Medicine

## 2024-05-29 DIAGNOSIS — I10 Essential (primary) hypertension: Secondary | ICD-10-CM

## 2024-06-12 ENCOUNTER — Other Ambulatory Visit: Payer: Self-pay | Admitting: Nurse Practitioner

## 2024-06-12 ENCOUNTER — Other Ambulatory Visit: Payer: Self-pay | Admitting: Family Medicine

## 2024-06-12 DIAGNOSIS — E876 Hypokalemia: Secondary | ICD-10-CM

## 2024-06-12 DIAGNOSIS — I10 Essential (primary) hypertension: Secondary | ICD-10-CM

## 2024-06-12 DIAGNOSIS — F5102 Adjustment insomnia: Secondary | ICD-10-CM

## 2024-06-18 ENCOUNTER — Ambulatory Visit (INDEPENDENT_AMBULATORY_CARE_PROVIDER_SITE_OTHER): Admitting: Family Medicine

## 2024-06-18 VITALS — BP 116/72 | HR 64 | Temp 97.6°F | Ht 73.0 in | Wt 183.6 lb

## 2024-06-18 DIAGNOSIS — E876 Hypokalemia: Secondary | ICD-10-CM | POA: Diagnosis not present

## 2024-06-18 DIAGNOSIS — F341 Dysthymic disorder: Secondary | ICD-10-CM

## 2024-06-18 DIAGNOSIS — I10 Essential (primary) hypertension: Secondary | ICD-10-CM | POA: Diagnosis not present

## 2024-06-18 DIAGNOSIS — Z0000 Encounter for general adult medical examination without abnormal findings: Secondary | ICD-10-CM | POA: Diagnosis not present

## 2024-06-18 DIAGNOSIS — Z131 Encounter for screening for diabetes mellitus: Secondary | ICD-10-CM

## 2024-06-18 DIAGNOSIS — Z13220 Encounter for screening for lipoid disorders: Secondary | ICD-10-CM

## 2024-06-18 DIAGNOSIS — F5102 Adjustment insomnia: Secondary | ICD-10-CM

## 2024-06-18 DIAGNOSIS — Z1211 Encounter for screening for malignant neoplasm of colon: Secondary | ICD-10-CM

## 2024-06-18 DIAGNOSIS — Z125 Encounter for screening for malignant neoplasm of prostate: Secondary | ICD-10-CM

## 2024-06-18 MED ORDER — TRAZODONE HCL 50 MG PO TABS
25.0000 mg | ORAL_TABLET | Freq: Every evening | ORAL | 2 refills | Status: AC | PRN
Start: 2024-06-18 — End: ?

## 2024-06-18 MED ORDER — ESCITALOPRAM OXALATE 20 MG PO TABS: 20.0000 mg | ORAL_TABLET | Freq: Every day | ORAL | 3 refills | Status: AC

## 2024-06-18 MED ORDER — CHLORTHALIDONE 25 MG PO TABS: 25.0000 mg | ORAL_TABLET | Freq: Every day | ORAL | 3 refills | Status: AC

## 2024-06-18 MED ORDER — AMLODIPINE BESYLATE 5 MG PO TABS: 5.0000 mg | ORAL_TABLET | Freq: Every day | ORAL | 3 refills | Status: AC

## 2024-06-18 MED ORDER — POTASSIUM CHLORIDE CRYS ER 20 MEQ PO TBCR: 20.0000 meq | EXTENDED_RELEASE_TABLET | Freq: Every day | ORAL | 3 refills | Status: AC

## 2024-06-18 NOTE — Progress Notes (Signed)
 Established Patient Office Visit   Subjective:  Patient ID: Connor Stone, male    DOB: 14-Jan-1971  Age: 53 y.o. MRN: [id number]  Chief Complaint  Patient presents with   Medical Management of Chronic Issues    Follow up. Pt is not fasting. Due for Prevnar and Colonoscopy.     HPI Encounter Diagnoses  Name Primary?   Healthcare maintenance Yes   Essential hypertension    Dysthymia    Hypokalemia    Screening for prostate cancer    Screening for cholesterol level    Screening for diabetes mellitus    Adjustment insomnia    Screening for colon cancer    For physical and follow-up of the above.  Blood pressure well-controlled with chlorthalidone  and amlodipine .  Continues Lexapro  and trazodone  for dysthymia with anxiety and insomnia.  Medications are working well for him.  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  He is exercising regularly and has regular dental care.   Review of Systems  Constitutional: Negative.   HENT: Negative.    Eyes:  Negative for blurred vision, discharge and redness.  Respiratory: Negative.    Cardiovascular: Negative.   Gastrointestinal:  Negative for abdominal pain.  Genitourinary: Negative.   Musculoskeletal: Negative.  Negative for myalgias.  Skin:  Negative for rash.  Neurological:  Negative for tingling, loss of consciousness and weakness.  Endo/Heme/Allergies:  Negative for polydipsia.      06/18/2024    1:38 PM 03/07/2023    1:19 PM 12/06/2022    3:39 PM  Depression screen PHQ 2/9  Decreased Interest 0 0 0  Down, Depressed, Hopeless 0 0 0  PHQ - 2 Score 0 0 0  Altered sleeping 0    Tired, decreased energy 0    Change in appetite 0    Feeling bad or failure about yourself  1    Trouble concentrating 0    Moving slowly or fidgety/restless 0    Suicidal thoughts 0    PHQ-9 Score 1    Difficult doing work/chores Not difficult at all  Not difficult at all      Current Outpatient Medications:    OLUMIANT tablet, daily.,  Disp: , Rfl:    ondansetron  (ZOFRAN ) 4 MG tablet, Take 4 mg by mouth every 8 (eight) hours as needed for nausea or vomiting., Disp: , Rfl:    amLODipine  (NORVASC ) 5 MG tablet, Take 1 tablet (5 mg total) by mouth daily., Disp: 90 tablet, Rfl: 3   chlorthalidone  (HYGROTON ) 25 MG tablet, Take 1 tablet (25 mg total) by mouth daily., Disp: 90 tablet, Rfl: 3   escitalopram  (LEXAPRO ) 20 MG tablet, Take 1 tablet (20 mg total) by mouth daily., Disp: 90 tablet, Rfl: 3   potassium chloride  SA (KLOR-CON  M) 20 MEQ tablet, Take 1 tablet (20 mEq total) by mouth daily., Disp: 90 tablet, Rfl: 3   traZODone  (DESYREL ) 50 MG tablet, Take 0.5-1 tablets (25-50 mg total) by mouth at bedtime as needed. for sleep, Disp: 90 tablet, Rfl: 2   Objective:     BP 116/72 (BP Location: Right Arm, Patient Position: Sitting, Cuff Size: Normal)   Pulse 64   Temp 97.6 F (36.4 C) (Temporal)   Ht 6' 1 (1.854 m)   Wt 183 lb 9.6 oz (83.3 kg)   SpO2 97%   BMI 24.22 kg/m  BP Readings from Last 3 Encounters:  06/18/24 116/72  12/13/23 110/70  06/27/23 124/72   Wt Readings from Last 3  Encounters:  06/18/24 183 lb 9.6 oz (83.3 kg)  12/13/23 183 lb (83 kg)  06/27/23 182 lb 12.8 oz (82.9 kg)      Physical Exam Constitutional:      General: He is not in acute distress.    Appearance: Normal appearance. He is not ill-appearing, toxic-appearing or diaphoretic.  HENT:     Head: Normocephalic and atraumatic.     Right Ear: Tympanic membrane, ear canal and external ear normal.     Left Ear: Tympanic membrane, ear canal and external ear normal.     Mouth/Throat:     Mouth: Mucous membranes are moist.     Pharynx: Oropharynx is clear. No oropharyngeal exudate or posterior oropharyngeal erythema.  Eyes:     General: No scleral icterus.       Right eye: No discharge.        Left eye: No discharge.     Extraocular Movements: Extraocular movements intact.     Conjunctiva/sclera: Conjunctivae normal.     Pupils: Pupils are  equal, round, and reactive to light.  Cardiovascular:     Rate and Rhythm: Normal rate and regular rhythm.  Pulmonary:     Effort: Pulmonary effort is normal. No respiratory distress.     Breath sounds: Normal breath sounds.  Abdominal:     General: Bowel sounds are normal.     Tenderness: There is no abdominal tenderness. There is no guarding.     Hernia: There is no hernia in the left inguinal area or right inguinal area.  Genitourinary:    Penis: Circumcised. No hypospadias, erythema, tenderness, discharge, swelling or lesions.      Testes:        Right: Mass, tenderness or swelling not present. Right testis is descended.        Left: Mass, tenderness or swelling not present. Left testis is descended.     Epididymis:     Right: Not inflamed or enlarged.     Left: Not inflamed or enlarged.  Musculoskeletal:     Cervical back: No rigidity or tenderness.  Lymphadenopathy:     Lower Body: No right inguinal adenopathy. No left inguinal adenopathy.  Skin:    General: Skin is warm and dry.  Neurological:     Mental Status: He is alert and oriented to person, place, and time.  Psychiatric:        Mood and Affect: Mood normal.        Behavior: Behavior normal.      No results found for any visits on 06/18/24.    The ASCVD Risk score (Arnett DK, et al., 2019) failed to calculate for the following reasons:   Cannot find a previous HDL lab   Cannot find a previous total cholesterol lab    Assessment & Plan:   Healthcare maintenance  Essential hypertension -     CBC; Future -     Comprehensive metabolic panel with GFR; Future -     Urinalysis, Routine w reflex microscopic; Future -     amLODIPine  Besylate; Take 1 tablet (5 mg total) by mouth daily.  Dispense: 90 tablet; Refill: 3 -     Chlorthalidone ; Take 1 tablet (25 mg total) by mouth daily.  Dispense: 90 tablet; Refill: 3 -     Potassium Chloride  Crys ER; Take 1 tablet (20 mEq total) by mouth daily.  Dispense: 90 tablet;  Refill: 3  Dysthymia -     Escitalopram  Oxalate; Take 1 tablet (20 mg total) by  mouth daily.  Dispense: 90 tablet; Refill: 3  Hypokalemia -     Comprehensive metabolic panel with GFR; Future -     Potassium Chloride  Crys ER; Take 1 tablet (20 mEq total) by mouth daily.  Dispense: 90 tablet; Refill: 3  Screening for prostate cancer -     PSA; Future  Screening for cholesterol level -     Comprehensive metabolic panel with GFR; Future -     Lipid panel; Future  Screening for diabetes mellitus -     Comprehensive metabolic panel with GFR; Future -     Hemoglobin A1c; Future  Adjustment insomnia -     Escitalopram  Oxalate; Take 1 tablet (20 mg total) by mouth daily.  Dispense: 90 tablet; Refill: 3 -     traZODone  HCl; Take 0.5-1 tablets (25-50 mg total) by mouth at bedtime as needed. for sleep  Dispense: 90 tablet; Refill: 2  Screening for colon cancer -     Ambulatory referral to Gastroenterology    Return in about 1 year (around 06/18/2025), or if symptoms worsen or fail to improve.  Will return fasting tomorrow for blood work.  Continue all medications as above.  Referral for colonoscopy.  Elsie Sim Lent, MD

## 2024-06-19 ENCOUNTER — Other Ambulatory Visit

## 2024-09-17 ENCOUNTER — Encounter: Payer: Self-pay | Admitting: Internal Medicine
